# Patient Record
Sex: Female | Born: 1990 | Race: White | Hispanic: No | Marital: Single
Health system: Southern US, Academic
[De-identification: ages and names within clinical notes are randomized; demographics above are authoritative.]

## PROBLEM LIST (undated history)

## (undated) ENCOUNTER — Encounter

## (undated) ENCOUNTER — Encounter: Attending: Pediatrics | Primary: Pediatrics

## (undated) ENCOUNTER — Encounter: Attending: Physician Assistant | Primary: Physician Assistant

## (undated) ENCOUNTER — Encounter: Attending: Family | Primary: Family

## (undated) ENCOUNTER — Ambulatory Visit: Payer: PRIVATE HEALTH INSURANCE | Attending: Registered" | Primary: Registered"

## (undated) ENCOUNTER — Telehealth

## (undated) ENCOUNTER — Encounter
Attending: Student in an Organized Health Care Education/Training Program | Primary: Student in an Organized Health Care Education/Training Program

## (undated) ENCOUNTER — Ambulatory Visit: Payer: PRIVATE HEALTH INSURANCE

## (undated) ENCOUNTER — Ambulatory Visit
Payer: BLUE CROSS/BLUE SHIELD | Attending: Student in an Organized Health Care Education/Training Program | Primary: Student in an Organized Health Care Education/Training Program

## (undated) ENCOUNTER — Ambulatory Visit
Payer: PRIVATE HEALTH INSURANCE | Attending: Student in an Organized Health Care Education/Training Program | Primary: Student in an Organized Health Care Education/Training Program

## (undated) ENCOUNTER — Ambulatory Visit: Payer: PRIVATE HEALTH INSURANCE | Attending: Internal Medicine | Primary: Internal Medicine

## (undated) ENCOUNTER — Ambulatory Visit: Payer: BLUE CROSS/BLUE SHIELD

## (undated) ENCOUNTER — Encounter: Attending: Registered" | Primary: Registered"

## (undated) ENCOUNTER — Ambulatory Visit

## (undated) ENCOUNTER — Ambulatory Visit: Payer: BLUE CROSS/BLUE SHIELD | Attending: Social Worker | Primary: Social Worker

## (undated) ENCOUNTER — Ambulatory Visit: Attending: Social Worker | Primary: Social Worker

## (undated) ENCOUNTER — Telehealth: Attending: Otolaryngology | Primary: Otolaryngology

## (undated) ENCOUNTER — Ambulatory Visit
Attending: Student in an Organized Health Care Education/Training Program | Primary: Student in an Organized Health Care Education/Training Program

## (undated) ENCOUNTER — Ambulatory Visit: Payer: PRIVATE HEALTH INSURANCE | Attending: Social Worker | Primary: Social Worker

## (undated) ENCOUNTER — Encounter: Attending: Social Worker | Primary: Social Worker

## (undated) ENCOUNTER — Ambulatory Visit: Payer: BLUE CROSS/BLUE SHIELD | Attending: Physician Assistant | Primary: Physician Assistant

## (undated) ENCOUNTER — Ambulatory Visit: Payer: PRIVATE HEALTH INSURANCE | Attending: Family | Primary: Family

## (undated) ENCOUNTER — Ambulatory Visit: Payer: PRIVATE HEALTH INSURANCE | Attending: Physician Assistant | Primary: Physician Assistant

## (undated) ENCOUNTER — Ambulatory Visit: Payer: BLUE CROSS/BLUE SHIELD | Attending: Internal Medicine | Primary: Internal Medicine

## (undated) ENCOUNTER — Encounter: Attending: Internal Medicine | Primary: Internal Medicine

## (undated) ENCOUNTER — Encounter: Attending: Otolaryngology | Primary: Otolaryngology

## (undated) ENCOUNTER — Ambulatory Visit
Payer: PRIVATE HEALTH INSURANCE | Attending: Rehabilitative and Restorative Service Providers" | Primary: Rehabilitative and Restorative Service Providers"

## (undated) ENCOUNTER — Other Ambulatory Visit: Attending: Social Worker | Primary: Social Worker

## (undated) ENCOUNTER — Other Ambulatory Visit

## (undated) ENCOUNTER — Telehealth: Attending: Physician Assistant | Primary: Physician Assistant

## (undated) DIAGNOSIS — J45909 Unspecified asthma, uncomplicated: Secondary | ICD-10-CM

## (undated) DIAGNOSIS — Z77120 Contact with and (suspected) exposure to mold (toxic): Secondary | ICD-10-CM

## (undated) DIAGNOSIS — J383 Other diseases of vocal cords: Secondary | ICD-10-CM

## (undated) DIAGNOSIS — R599 Enlarged lymph nodes, unspecified: Secondary | ICD-10-CM

## (undated) HISTORY — DX: Unspecified asthma, uncomplicated: J45.909

## (undated) HISTORY — DX: Other diseases of vocal cords: J38.3

## (undated) HISTORY — DX: Enlarged lymph nodes, unspecified: R59.9

## (undated) HISTORY — PX: WISDOM TOOTH EXTRACTION: SHX21

## (undated) NOTE — *Deleted (*Deleted)
I value your feedback and entrusting us with your care. If you get a [place] patient survey, I would appreciate you taking the time to let us know about your experience today. Thank you!  As of February 18, 2019, your lab results will be released to your MyChart immediately, before I even have a chance to see them. Please give me time to review them and contact you if there are any abnormalities. Thank you for your patience.  

---
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
## 2012-09-03 ENCOUNTER — Emergency Department: Payer: Self-pay | Admitting: Emergency Medicine

## 2012-10-06 ENCOUNTER — Emergency Department: Payer: Self-pay | Admitting: Emergency Medicine

## 2012-10-06 LAB — CBC
HCT: 34.3 % — ABNORMAL LOW (ref 35.0–47.0)
HGB: 11.2 g/dL — ABNORMAL LOW (ref 12.0–16.0)
MCH: 25.6 pg — ABNORMAL LOW (ref 26.0–34.0)
MCHC: 32.6 g/dL (ref 32.0–36.0)
MCV: 79 fL — ABNORMAL LOW (ref 80–100)
Platelet: 360 10*3/uL (ref 150–440)
RBC: 4.36 10*6/uL (ref 3.80–5.20)
RDW: 14.7 % — ABNORMAL HIGH (ref 11.5–14.5)
WBC: 15 10*3/uL — ABNORMAL HIGH (ref 3.6–11.0)

## 2012-10-06 LAB — BASIC METABOLIC PANEL
Anion Gap: 6 — ABNORMAL LOW (ref 7–16)
BUN: 13 mg/dL (ref 7–18)
Chloride: 107 mmol/L (ref 98–107)
Co2: 27 mmol/L (ref 21–32)
EGFR (African American): >60
EGFR (Non-African Amer.): >60
Glucose: 123 mg/dL — ABNORMAL HIGH (ref 65–99)
Osmolality: 281 (ref 275–301)
Sodium: 140 mmol/L (ref 136–145)

## 2012-10-08 ENCOUNTER — Emergency Department: Payer: Self-pay | Admitting: Emergency Medicine

## 2012-10-09 LAB — URINALYSIS, COMPLETE
Blood: NEGATIVE
Glucose,UR: NEGATIVE mg/dL (ref 0–75)
Ketone: NEGATIVE
Ph: 7 (ref 4.5–8.0)
Protein: NEGATIVE
WBC UR: <1 /HPF (ref 0–5)

## 2013-04-05 ENCOUNTER — Emergency Department: Payer: Self-pay | Admitting: Emergency Medicine

## 2013-11-25 ENCOUNTER — Emergency Department: Payer: Self-pay | Admitting: Emergency Medicine

## 2013-12-18 ENCOUNTER — Emergency Department: Payer: Self-pay | Admitting: Emergency Medicine

## 2013-12-18 LAB — URINALYSIS, COMPLETE
Bacteria: NONE SEEN
Bilirubin,UR: NEGATIVE
Blood: NEGATIVE
Glucose,UR: NEGATIVE mg/dL (ref 0–75)
Ketone: NEGATIVE
Leukocyte Esterase: NEGATIVE
Nitrite: NEGATIVE
Ph: 5 (ref 4.5–8.0)
Protein: 30
RBC,UR: 3 /HPF (ref 0–5)
Specific Gravity: 1.033 (ref 1.003–1.030)
Squamous Epithelial: 10

## 2013-12-18 LAB — COMPREHENSIVE METABOLIC PANEL
ALK PHOS: 108 U/L
AST: 19 U/L (ref 15–37)
Albumin: 3.5 g/dL (ref 3.4–5.0)
Anion Gap: 7 (ref 7–16)
BILIRUBIN TOTAL: 0.4 mg/dL (ref 0.2–1.0)
BUN: 11 mg/dL (ref 7–18)
CREATININE: 0.68 mg/dL (ref 0.60–1.30)
Calcium, Total: 8.3 mg/dL — ABNORMAL LOW (ref 8.5–10.1)
Chloride: 103 mmol/L (ref 98–107)
Co2: 26 mmol/L (ref 21–32)
EGFR (Non-African Amer.): >60
GLUCOSE: 97 mg/dL (ref 65–99)
OSMOLALITY: 271 (ref 275–301)
Potassium: 3.3 mmol/L — ABNORMAL LOW (ref 3.5–5.1)
SGPT (ALT): 46 U/L
Sodium: 136 mmol/L (ref 136–145)
Total Protein: 7.2 g/dL (ref 6.4–8.2)

## 2013-12-18 LAB — CBC
HCT: 36 % (ref 35.0–47.0)
HGB: 11.2 g/dL — ABNORMAL LOW (ref 12.0–16.0)
MCH: 25.2 pg — ABNORMAL LOW (ref 26.0–34.0)
MCHC: 31.1 g/dL — AB (ref 32.0–36.0)
MCV: 81 fL (ref 80–100)
PLATELETS: 331 10*3/uL (ref 150–440)
RBC: 4.45 10*6/uL (ref 3.80–5.20)
RDW: 14.4 % (ref 11.5–14.5)
WBC: 10.5 10*3/uL (ref 3.6–11.0)

## 2013-12-18 LAB — PREGNANCY, URINE: Pregnancy Test, Urine: NEGATIVE m[IU]/mL

## 2014-04-24 ENCOUNTER — Emergency Department: Payer: Self-pay | Admitting: Emergency Medicine

## 2015-01-19 ENCOUNTER — Other Ambulatory Visit: Payer: Self-pay | Admitting: Counselor

## 2015-01-19 ENCOUNTER — Ambulatory Visit
Admission: RE | Admit: 2015-01-19 | Discharge: 2015-01-19 | Disposition: A | Discharge status: Home / Self Care | Payer: 59 | Source: Ambulatory Visit | Attending: Counselor | Admitting: Counselor

## 2015-01-19 DIAGNOSIS — R319 Hematuria, unspecified: Secondary | ICD-10-CM

## 2015-01-19 DIAGNOSIS — R1010 Upper abdominal pain, unspecified: Secondary | ICD-10-CM | POA: Insufficient documentation

## 2015-01-19 DIAGNOSIS — R109 Unspecified abdominal pain: Secondary | ICD-10-CM

## 2018-03-11 HISTORY — PX: LIVER BIOPSY: SHX301

## 2018-05-14 DIAGNOSIS — K257 Chronic gastric ulcer without hemorrhage or perforation: Secondary | ICD-10-CM | POA: Insufficient documentation

## 2018-06-26 ENCOUNTER — Emergency Department: Admit: 2018-06-26 | Discharge: 2018-06-26 | Disposition: A | Discharge status: Home / Self Care

## 2018-06-26 ENCOUNTER — Ambulatory Visit: Admit: 2018-06-26 | Discharge: 2018-06-26 | Disposition: A | Discharge status: Home / Self Care

## 2018-06-26 DIAGNOSIS — R1012 Left upper quadrant pain: Principal | ICD-10-CM

## 2018-09-09 DIAGNOSIS — Z77120 Contact with and (suspected) exposure to mold (toxic): Secondary | ICD-10-CM

## 2018-09-09 HISTORY — DX: Contact with and (suspected) exposure to mold (toxic): Z77.120

## 2018-10-02 ENCOUNTER — Ambulatory Visit: Admit: 2018-10-02 | Discharge: 2018-10-03

## 2018-10-02 DIAGNOSIS — Z77120 Contact with and (suspected) exposure to mold (toxic): Secondary | ICD-10-CM

## 2018-10-02 DIAGNOSIS — Z20828 Contact with and (suspected) exposure to other viral communicable diseases: Principal | ICD-10-CM

## 2018-10-02 DIAGNOSIS — R5383 Other fatigue: Secondary | ICD-10-CM

## 2018-10-13 ENCOUNTER — Ambulatory Visit: Admit: 2018-10-13 | Discharge: 2018-10-13 | Disposition: A | Discharge status: Home / Self Care

## 2018-10-13 ENCOUNTER — Emergency Department: Admit: 2018-10-13 | Discharge: 2018-10-13 | Disposition: A | Discharge status: Home / Self Care

## 2018-10-13 DIAGNOSIS — R0602 Shortness of breath: Principal | ICD-10-CM

## 2018-10-16 ENCOUNTER — Telehealth: Admit: 2018-10-16 | Discharge: 2018-10-17

## 2018-10-16 DIAGNOSIS — R52 Pain, unspecified: Principal | ICD-10-CM

## 2018-10-16 DIAGNOSIS — R0602 Shortness of breath: Secondary | ICD-10-CM

## 2018-10-19 ENCOUNTER — Telehealth: Admit: 2018-10-19 | Discharge: 2018-10-20 | Attending: Pediatrics | Primary: Pediatrics

## 2018-10-19 ENCOUNTER — Ambulatory Visit: Admit: 2018-10-19 | Discharge: 2018-10-20

## 2018-10-19 DIAGNOSIS — Z77120 Contact with and (suspected) exposure to mold (toxic): Principal | ICD-10-CM

## 2018-10-19 DIAGNOSIS — R0789 Other chest pain: Secondary | ICD-10-CM

## 2018-10-19 DIAGNOSIS — J310 Chronic rhinitis: Secondary | ICD-10-CM

## 2018-10-19 MED ORDER — ALBUTEROL SULFATE HFA 90 MCG/ACTUATION AEROSOL INHALER
RESPIRATORY_TRACT | 12 refills | 0.00000 days | Status: CP | PRN
Start: 2018-10-19 — End: ?

## 2018-10-19 MED ORDER — AEROCHAMBER MV SPACER
Freq: Once | 0 refills | 0 days | Status: CP | PRN
Start: 2018-10-19 — End: ?

## 2018-10-19 MED ORDER — AZELASTINE 137 MCG (0.1 %) NASAL SPRAY AEROSOL
Freq: Two times a day (BID) | NASAL | 12 refills | 0 days | Status: CP
Start: 2018-10-19 — End: ?

## 2018-10-22 DIAGNOSIS — J4520 Mild intermittent asthma, uncomplicated: Secondary | ICD-10-CM | POA: Insufficient documentation

## 2018-11-19 ENCOUNTER — Other Ambulatory Visit: Payer: Self-pay

## 2018-11-19 NOTE — Progress Notes (Signed)
Called patient no answer left message. Advised no visitor policy and that we will screen her and check temp upon arrival.

## 2018-11-20 ENCOUNTER — Encounter (INDEPENDENT_AMBULATORY_CARE_PROVIDER_SITE_OTHER): Payer: Self-pay

## 2018-11-20 ENCOUNTER — Other Ambulatory Visit: Payer: Self-pay

## 2018-11-20 ENCOUNTER — Encounter: Payer: Self-pay | Admitting: Oncology

## 2018-11-20 ENCOUNTER — Inpatient Hospital Stay: Payer: Self-pay

## 2018-11-20 ENCOUNTER — Inpatient Hospital Stay: Payer: Self-pay | Attending: Oncology | Admitting: Oncology

## 2018-11-20 ENCOUNTER — Ambulatory Visit
Admission: RE | Admit: 2018-11-20 | Discharge: 2018-11-20 | Disposition: A | Discharge status: Home / Self Care | Payer: Self-pay | Source: Ambulatory Visit | Attending: Oncology | Admitting: Oncology

## 2018-11-20 VITALS — BP 117/80 | HR 101 | Temp 96.4°F | Resp 18 | Ht 70.0 in | Wt 268.0 lb

## 2018-11-20 DIAGNOSIS — R590 Localized enlarged lymph nodes: Secondary | ICD-10-CM

## 2018-11-20 DIAGNOSIS — R0602 Shortness of breath: Secondary | ICD-10-CM

## 2018-11-20 DIAGNOSIS — R0789 Other chest pain: Secondary | ICD-10-CM

## 2018-11-20 DIAGNOSIS — R740 Nonspecific elevation of levels of transaminase and lactic acid dehydrogenase [LDH]: Secondary | ICD-10-CM

## 2018-11-20 DIAGNOSIS — R7401 Elevation of levels of liver transaminase levels: Secondary | ICD-10-CM

## 2018-11-20 LAB — COMPREHENSIVE METABOLIC PANEL
ALT: 191 U/L — ABNORMAL HIGH (ref 0–44)
AST: 67 U/L — ABNORMAL HIGH (ref 15–41)
Albumin: 4.1 g/dL (ref 3.5–5.0)
Alkaline Phosphatase: 147 U/L — ABNORMAL HIGH (ref 38–126)
Anion gap: 10 (ref 5–15)
BUN: 13 mg/dL (ref 6–20)
CO2: 25 mmol/L (ref 22–32)
Calcium: 9.2 mg/dL (ref 8.9–10.3)
Chloride: 103 mmol/L (ref 98–111)
Creatinine, Ser: 0.55 mg/dL (ref 0.44–1.00)
GFR calc Af Amer: >60 mL/min (ref >60)
GFR calc non Af Amer: >60 mL/min (ref >60)
Glucose, Bld: 102 mg/dL — ABNORMAL HIGH (ref 70–99)
Potassium: 3.7 mmol/L (ref 3.5–5.1)
Sodium: 138 mmol/L (ref 135–145)
Total Bilirubin: 0.4 mg/dL (ref 0.3–1.2)
Total Protein: 7.5 g/dL (ref 6.5–8.1)

## 2018-11-20 LAB — CBC WITH DIFFERENTIAL/PLATELET
Abs Immature Granulocytes: 0.02 10*3/uL (ref 0.00–0.07)
Basophils Absolute: 0 10*3/uL (ref 0.0–0.1)
Basophils Relative: 0 %
Eosinophils Absolute: 0.1 10*3/uL (ref 0.0–0.5)
Eosinophils Relative: 2 %
HCT: 41.5 % (ref 36.0–46.0)
Hemoglobin: 13.7 g/dL (ref 12.0–15.0)
Immature Granulocytes: 0 %
Lymphocytes Relative: 25 %
Lymphs Abs: 2 10*3/uL (ref 0.7–4.0)
MCH: 27.3 pg (ref 26.0–34.0)
MCHC: 33 g/dL (ref 30.0–36.0)
MCV: 82.8 fL (ref 80.0–100.0)
Monocytes Absolute: 0.4 10*3/uL (ref 0.1–1.0)
Monocytes Relative: 5 %
Neutro Abs: 5.4 10*3/uL (ref 1.7–7.7)
Neutrophils Relative %: 68 %
Platelets: 361 10*3/uL (ref 150–400)
RBC: 5.01 MIL/uL (ref 3.87–5.11)
RDW: 13 % (ref 11.5–15.5)
WBC: 7.9 10*3/uL (ref 4.0–10.5)
nRBC: 0 % (ref 0.0–0.2)

## 2018-11-20 LAB — TECHNOLOGIST SMEAR REVIEW
Plt Morphology: ADEQUATE
RBC Morphology: NORMAL

## 2018-11-20 LAB — C-REACTIVE PROTEIN: CRP: 1.2 mg/dL — ABNORMAL HIGH (ref <1.0)

## 2018-11-20 LAB — HCG, QUANTITATIVE, PREGNANCY: hCG, Beta Chain, Quant, S: <1 m[IU]/mL (ref <5)

## 2018-11-20 LAB — LACTATE DEHYDROGENASE: LDH: 120 U/L (ref 98–192)

## 2018-11-20 MED ORDER — IOHEXOL 350 MG/ML SOLN
75.0000 mL | Freq: Once | INTRAVENOUS | Status: AC | PRN
  Administered 2018-11-20: 14:00:00 75 mL via INTRAVENOUS

## 2018-11-20 NOTE — Progress Notes (Signed)
New patient visit for enlarged lymph nodes.  Prolonged mold exposure .

## 2018-11-21 LAB — ANA W/REFLEX: Anti Nuclear Antibody (ANA): NEGATIVE

## 2018-11-21 NOTE — Progress Notes (Addendum)
Hematology/Oncology Consult note [facility] [phone] [phone]   Patient Care Team: Lorn Junesrawford, Anna E, FNP as PCP - General (Family Medicine)  REFERRING PROVIDER: Lorn Junesrawford, Anna E, FNP  CHIEF COMPLAINTS/REASON FOR VISIT:  Evaluation of cervical and axillary lymph node enlargement.  HISTORY OF PRESENTING ILLNESS:   Marissa Savage is a  28 y.o.  female with PMH listed below was seen in consultation at the request of  Lorn JunesCrawford, Anna E, FNP  for evaluation of cervical and axillary lymph node enlargement. Patient reports a history of mold exposure from January 2000 30 September 2018. She noticed the mold problem in July.  Soon after that she started to experience symptoms including neck lumps, which she thinks is due to lymph node enlargement.  She also felt a lump of the posterior neck/back of her head which was a golf ball size, tender.  Also reports bilateral axillary soreness, no exacerbating or alleviating factors.  Soreness is constant. Also reports to have intermittent wheezing, shortness of breath, and mid chest pain, which is worsened with deep breath.  She feels the chest pain is not getting better.  No exacerbating or alleviating factors. Denies any fever, chills, unintentional weight loss, night sweating.  Patient has history of asthma.  Gastritis, possible IBS.  She sees [facility].   Review of Systems  Constitutional: Positive for fatigue. Negative for appetite change, chills and fever.  HENT:   Negative for hearing loss and voice change.   Eyes: Negative for eye problems.  Respiratory: Negative for chest tightness and cough.   Cardiovascular: Negative for chest pain.  Gastrointestinal: Negative for abdominal distention, abdominal pain and blood in stool.  Endocrine: Negative for hot flashes.  Genitourinary: Negative for difficulty urinating and frequency.   Musculoskeletal: Positive for arthralgias.       Bilateral knee pain,  Skin:  Negative for itching and rash.  Neurological: Negative for extremity weakness.  Hematological: Positive for adenopathy.  Psychiatric/Behavioral: Negative for confusion.  Bilateral axillary soreness.  MEDICAL HISTORY:  Past Medical History:  Diagnosis Date   Asthma    Enlarged lymph nodes    Mold exposure 09/2018   Vocal cord dysfunction     SURGICAL HISTORY: Past Surgical History:  Procedure Laterality Date   WISDOM TOOTH EXTRACTION      SOCIAL HISTORY: Social History   Socioeconomic History   Marital status: Single    Spouse name: Not on file   Number of children: Not on file   Years of education: Not on file   Highest education level: Not on file  Occupational History   Occupation: unemployed  [employer]   Financial resource strain: Not on file   Food insecurity    Worry: Not on file    Inability: Not on file   Transportation needs    Medical: Not on file    Non-medical: Not on file  Tobacco Use   Smoking status: Never Smoker   Smokeless tobacco: Never Used  Substance and Sexual Activity   Alcohol use: Not Currently   Drug use: Not Currently    Types: Marijuana   Sexual activity: Yes  Lifestyle   Physical activity    Days per week: Not on file    Minutes per session: Not on file   Stress: Not on file  Relationships   Social connections    Talks on phone: Not on file    Gets together: Not on file    Attends religious service: Not on  file    Active member of club or organization: Not on file    Attends meetings of clubs or organizations: Not on file    Relationship status: Not on file   Intimate partner violence    Fear of current or ex partner: Not on file    Emotionally abused: Not on file    Physically abused: Not on file    Forced sexual activity: Not on file  Other Topics Concern   Not on file  Social History Narrative   Not on file    FAMILY HISTORY: Family History  Problem Relation Age of Onset   Breast  cancer Maternal Aunt    Leukemia Maternal Grandfather    Stomach cancer Paternal Grandmother     ALLERGIES:  is allergic to ciprofloxacin and zyrtec [cetirizine].  MEDICATIONS:  Current Outpatient Medications  Medication Sig Dispense Refill   albuterol (VENTOLIN HFA) 108 (90 Base) MCG/ACT inhaler Inhale into the lungs.     amoxicillin-clavulanate (AUGMENTIN) 875-125 MG tablet Take by mouth.     dicyclomine (BENTYL) 20 MG tablet Take by mouth.     fluticasone (FLOVENT HFA) 110 MCG/ACT inhaler Inhale into the lungs.     No current facility-administered medications for this visit.      PHYSICAL EXAMINATION: ECOG PERFORMANCE STATUS: 1 - Symptomatic but completely ambulatory Vitals:   11/20/18 1117  BP: 117/80  Pulse: (!) 101  Resp: 18  Temp: (!) 96.4 F (35.8 C)  SpO2: 97%   Filed Weights   11/20/18 1117  Weight: 268 lb (121.6 kg)    Physical Exam Constitutional:      General: She is not in acute distress.    Appearance: She is obese.  HENT:     Head: Normocephalic and atraumatic.  Eyes:     General: No scleral icterus.    Pupils: Pupils are equal, round, and reactive to light.  Neck:     Musculoskeletal: Normal range of motion and neck supple.     Comments: Small soft tender cervical lymph nodes on the right side, less than 1 cm, mobile. Cardiovascular:     Rate and Rhythm: Normal rate and regular rhythm.     Heart sounds: Normal heart sounds.  Pulmonary:     Effort: Pulmonary effort is normal. No respiratory distress.     Breath sounds: No wheezing.  Abdominal:     General: Bowel sounds are normal. There is no distension.     Palpations: Abdomen is soft. There is no mass.     Tenderness: There is no abdominal tenderness.  Musculoskeletal: Normal range of motion.        General: No deformity.  Skin:    General: Skin is warm and dry.     Findings: No erythema or rash.  Neurological:     Mental Status: She is alert and oriented to person, place, and  time.     Cranial Nerves: No cranial nerve deficit.     Coordination: Coordination normal.  Psychiatric:     Comments: Anxious   Will do theBilateral axillary tender with palpation.  No palpable lymphadenopathy.  LABORATORY DATA:  I have reviewed the data as listed Lab Results  Component Value Date   WBC 7.9 11/20/2018   HGB 13.7 11/20/2018   HCT 41.5 11/20/2018   MCV 82.8 11/20/2018   PLT 361 11/20/2018   Recent Labs    11/20/18 1223  NA 138  K 3.7  CL 103  CO2 25  GLUCOSE 102*  BUN 13  CREATININE 0.55  CALCIUM 9.2  GFRNONAA >60  GFRAA >60  PROT 7.5  ALBUMIN 4.1  AST 67*  ALT 191*  ALKPHOS 147*  BILITOT 0.4   Iron/TIBC/Ferritin/ %Sat No results found for: IRON, TIBC, FERRITIN, IRONPCTSAT    RADIOGRAPHIC STUDIES: I have personally reviewed the radiological images as listed and agreed with the findings in the report.  Ct Angio Chest Pe W Or Wo Contrast  Result Date: 11/20/2018 CLINICAL DATA:  28 year old female with acute chest pain. History of mold exposure for 7 months. EXAM: CT ANGIOGRAPHY CHEST WITH CONTRAST TECHNIQUE: Multidetector CT imaging of the chest was performed using the standard protocol during bolus administration of intravenous contrast. Multiplanar CT image reconstructions and MIPs were obtained to evaluate the vascular anatomy. CONTRAST:  75mL OMNIPAQUE IOHEXOL 350 MG/ML SOLN COMPARISON:  06/08/2009 chest CT FINDINGS: Cardiovascular: This is a technically satisfactory study for evaluation of pulmonary emboli. No pulmonary emboli are identified. No thoracic aortic aneurysm noted. UPPER limits of normal heart size noted. No pericardial effusion. Mediastinum/Nodes: No enlarged mediastinal, hilar, or axillary lymph nodes. Thyroid gland, trachea, and esophagus demonstrate no significant findings. Lungs/Pleura: No airspace disease, consolidation, nodule, mass, pleural effusion or pneumothorax noted. No endobronchial or endotracheal lesions are identified.  Upper Abdomen: No acute abnormality. Probable mild hepatic steatosis noted. Musculoskeletal: Unremarkable. Review of the MIP images confirms the above findings. IMPRESSION: 1. No evidence of pulmonary emboli or thoracic aortic aneurysm. 2. UPPER limits of normal heart size. 3. Question mild hepatic steatosis. Electronically Signed   By: Harmon PierJeffrey  Hu M.D.   On: 11/20/2018 14:16      ASSESSMENT & PLAN:  1. Cervical lymphadenopathy   2. Other chest pain   3. Shortness of breath   4. Transaminitis   Physical examination reviewed very small tender cervical lymphadenopathy.  No palpable bilateral axillary lymphadenopathy.  Recommend ultrasound soft tissue neck for further evaluation.  She does not have any constitutional symptoms, less likely lymphoma. Check CBC, CMP, flow cytometry, ANA, CRP, LDH, smear,  Pleuritic chest pain, shortness of breath, need to rule out pulmonary embolism.  Patient does have risk factors including morbid obesity.  Discussed with patient and she agrees with proceeding with CT chest PE angiogram.  CT chest PE angiogram was independently reviewed by me.  There is no pulmonary embolism.  No enlarged mediastinal, hilar, axillary lymph nodes.  Awaiting cervical neck ultrasound. The small cervical lymph nodes most likely reactive in nature. Patient has elevated CRP, likely secondary to underlying inflammatory process. Transaminitis, previous CT abdomen pelvis also showed hepatomegaly and fatty liver disease.  Patient also used to drink alcohol.  Discussed with patient and encourage her efforts of alcohol cessation. Suggest patient to continue follow-up with primary care physician for further evaluation.  #Addendum, ultrasound neck soft tissue showed subcentimeter cervical lymph nodes most likely reactive. chest CT showed no blood clots, no lymph nodes enlargement. There are no axillary lymph node enlargement either.  US neck soft tissue showed very small lymph nodes, less than 1  cm, which are most likely reactive.  Blood work up did not show evidence of leukemia or lymphoma, smear is fine, CRP is elevated.   She should be able to review all her blood work and image reports in my chart in a few days.  No need for follow up with me. I recommend her to continue to follow up with primary care physician.    Orders Placed This Encounter  Procedures   US SOFT  TISSUE HEAD & NECK (NON-THYROID)    Standing Status:   Future    Standing Expiration Date:   01/20/2020    Order Specific Question:   Reason for exam:    Answer:   swollen lymphnodes    Order Specific Question:   Preferred imaging location?    Answer:   [facility]   CT ANGIO CHEST PE W OR WO CONTRAST    PT can leave after exam is complete    Standing Status:   Future    Number of Occurrences:   1    Standing Expiration Date:   02/19/2020    Order Specific Question:   ** REASON FOR EXAM (FREE TEXT)    Answer:   pleuritic chest pain    Order Specific Question:   If indicated for the ordered procedure, I authorize the administration of contrast media per Radiology protocol    Answer:   Yes    Order Specific Question:   Is patient pregnant?    Answer:   Yes    Order Specific Question:   Preferred imaging location?    Answer:   [facility]    Order Specific Question:   Call Results- Best Contact Number?    Answer:   [phone]    Order Specific Question:   Radiology Contrast Protocol - do NOT remove file path    Answer:   \[email or web address].[email or web address]   CBC with Differential/Platelet    Standing Status:   Future    Number of Occurrences:   1    Standing Expiration Date:   11/20/2019   Comprehensive metabolic panel    Standing Status:   Future    Number of Occurrences:   1    Standing Expiration Date:   11/20/2019   Lactate dehydrogenase    Standing Status:   Future    Number of Occurrences:   1    Standing Expiration Date:   11/20/2019   Technologist smear review     Standing Status:   Future    Number of Occurrences:   1    Standing Expiration Date:   11/20/2019   Flow cytometry panel-leukemia/lymphoma work-up    Standing Status:   Future    Number of Occurrences:   1    Standing Expiration Date:   11/20/2019   ANA w/Reflex    Standing Status:   Future    Number of Occurrences:   1    Standing Expiration Date:   11/20/2019   C-reactive protein    Standing Status:   Future    Number of Occurrences:   1    Standing Expiration Date:   11/20/2019    All questions were answered. The patient knows to call the clinic with any problems questions or concerns.  cc Bunnie Pion, FNP   Thank you for this kind referral and the opportunity to participate in the care of this patient. A copy of today's note is routed to referring provider  Total face to face encounter time for this patient visit was 60 min. >50% of the time was  spent in counseling and coordination of care.    Earlie Server, MD, PhD Hematology Oncology [facility] at [facility] Pager- [phone] 11/21/2018

## 2018-11-24 ENCOUNTER — Other Ambulatory Visit: Payer: Self-pay

## 2018-11-24 ENCOUNTER — Telehealth: Payer: Self-pay | Admitting: *Deleted

## 2018-11-24 ENCOUNTER — Encounter: Payer: Self-pay | Admitting: Oncology

## 2018-11-24 ENCOUNTER — Ambulatory Visit
Admission: RE | Admit: 2018-11-24 | Discharge: 2018-11-24 | Disposition: A | Discharge status: Home / Self Care | Payer: Self-pay | Source: Ambulatory Visit | Attending: Oncology | Admitting: Oncology

## 2018-11-24 DIAGNOSIS — R590 Localized enlarged lymph nodes: Secondary | ICD-10-CM | POA: Insufficient documentation

## 2018-11-24 LAB — COMP PANEL: LEUKEMIA/LYMPHOMA

## 2018-11-24 NOTE — Telephone Encounter (Signed)
Asking for results form last week. She has no follow up appointment scheduled   IMPRESSION: 1. No evidence of pulmonary emboli or thoracic aortic aneurysm. 2. UPPER limits of normal heart size. 3. Question mild hepatic steatosis.   Electronically Signed   By: Margarette Canada M.D.   On: 11/20/2018 14:16 Technologist smear review Order: [id number] Status:  Final result  Visible to patient:  No (not released)  Next appt:  None  Dx:  Cervical lymphadenopathy Component 4d ago  WBC Morphology UNREMARKABLE   RBC Morphology RBC MORPHOLOGY NORMAL   Tech Review PLATELETS APPEAR ADEQUATE   Comment: Normal platelet morphology  Performed at [facility], [address] [place], [place] [address]   Resulting Agency [facility] CLIN LAB      Specimen Collected: 11/20/18 12:23 Last Resulted: 11/20/18 13:24     Lab Flowsheet   Order Details   View Encounter   Lab and Collection Details   Routing   Result History         Other Results from 11/20/2018  Flow cytometry panel-leukemia/lymphoma work-up Order: [id number]  Status:  Edited Result - FINAL  Visible to patient:  No (not released)  Next appt:  None  Dx:  Cervical lymphadenopathy Component 4d ago  PATH INTERP XXX-IMP Comment   Comment: No significant immunophenotypic abnormality detected  CLINICAL INFO Comment VC   Comment: Accompanying CBC dated 11/20/2018 shows: WBC 7.9, Neu 5.4, Lym 2.0, Mon 0.4.  Specimen Type Comment   Comment: Peripheral blood  ASSESSMENT OF LEUKOCYTES Comment   Comment: (NOTE)  No monoclonal B cell population is detected. kappa:lambda ratio 1.2  There is no loss of, or aberrant expression of, the pan T cell  antigens to  suggest a neoplastic T cell process.  CD4:CD8 ratio 3.6  No circulating blasts are detected.  There is no immunophenotypic evidence of abnormal myeloid maturation.  Analysis of the leukocyte population shows: granulocytes 75%,  monocytes 5%,  lymphocytes 20%, blasts <0.1%,  B cells 4%, T cells 15%, NK cells 1%.   % Viable Cells Comment VC   Comment: 97%  ANALYSIS AND GATING STRATEGY Comment   Comment: 8 color analysis with CD45/SSC gating  IMMUNOPHENOTYPING STUDY Comment   Comment: (NOTE)  CD2    Normal     CD3    Normal  CD4    Normal     CD5    Normal  CD7    Normal     CD8    Normal  CD10   Normal     CD11b   Normal  CD13   Normal     CD14   Normal  CD16   Normal     CD19   Normal  CD20   Normal     CD33   Normal  CD34   Normal     CD38   Normal  CD45   Normal     CD56   Normal  CD57   Normal     CD117   Normal  HLA-DR  Normal     KAPPA   Normal  LAMBDA  Normal     CD64   Normal   PATHOLOGIST NAME Comment   Comment: Gaston Islam, M.D.  COMMENT: Comment VC   Comment: (NOTE)  Each antibody in this assay was utilized to assess for potential  abnormalities of studied cell populations or to characterize  identified abnormalities.  This test was developed and its performance characteristics  determined by [facility]. It  has not been cleared or approved by the  U.S. Food and Drug Administration.  The FDA has determined that such clearance or approval is not  necessary. This test is used for clinical purposes. It should not  be regarded as investigational or for research.  Performed At: -[facility]  [address] [place], [place] [id number]  Katina Degree MDPhD UD:[id number]  Performed At: [facility]  [address] [place], [place] [id number]  Katina Degree MDPhD [id number]   Resulting Agency [facility] CLIN LAB      Specimen Collected: 11/20/18 12:23 Last Resulted: 11/24/18 14:37     Lab Flowsheet   Order Details   View Encounter   Lab and Collection Details   Routing   Result History     VC=Value has a corrected status         Lactate dehydrogenase Order: [id number]  Status:  Final result  Visible to  patient:  No (not released)  Next appt:  None  Dx:  Cervical lymphadenopathy  Ref Range & Units 4d ago  LDH 98 - 192 U/L 120   Comment: Performed at [facility], [address] [place], [place] [address]  Resulting Agency  [facility] CLIN LAB      Specimen Collected: 11/20/18 12:23 Last Resulted: 11/20/18 12:42     Lab Flowsheet   Order Details   View Encounter   Lab and Collection Details   Routing   Result History           Contains abnormal data Comprehensive metabolic panel Order: [id number]  Status:  Final result  Visible to patient:  No (not released)  Next appt:  None  Dx:  Cervical lymphadenopathy  Ref Range & Units 4d ago  Sodium 135 - 145 mmol/L 138   Potassium 3.5 - 5.1 mmol/L 3.7   Chloride 98 - 111 mmol/L 103   CO2 22 - 32 mmol/L 25   Glucose, Bld 70 - 99 mg/dL 102High    BUN 6 - 20 mg/dL 13   Creatinine, Ser 0.44 - 1.00 mg/dL 0.55   Calcium 8.9 - 10.3 mg/dL 9.2   Total Protein 6.5 - 8.1 g/dL 7.5   Albumin 3.5 - 5.0 g/dL 4.1   AST 15 - 41 U/L 67High    ALT 0 - 44 U/L 191High    Alkaline Phosphatase 38 - 126 U/L 147High    Total Bilirubin 0.3 - 1.2 mg/dL 0.4   GFR calc non Af Amer >60 mL/min >60   GFR calc Af Amer >60 mL/min >60   Anion gap 5 - 15 10   Comment: Performed at [facility], [address] [place], [place] [address]  Resulting Agency  [facility] CLIN LAB      Specimen Collected: 11/20/18 12:23 Last Resulted: 11/20/18 12:42     Lab Flowsheet   Order Details   View Encounter   Lab and Collection Details   Routing   Result History           CBC with Differential/Platelet Order: [id number]  Status:  Final result  Visible to patient:  No (not released)  Next appt:  None  Dx:  Cervical lymphadenopathy  Ref Range & Units 4d ago  WBC 4.0 - 10.5 K/uL 7.9   RBC 3.87 - 5.11 MIL/uL 5.01   Hemoglobin 12.0 - 15.0 g/dL 13.7   HCT 36.0 - 46.0 % 41.5   MCV 80.0 - 100.0 fL 82.8   MCH 26.0 -  34.0 pg 27.3   MCHC 30.0 - 36.0  g/dL 33.0   RDW 11.5 - 15.5 % 13.0   Platelets 150 - 400 K/uL 361   nRBC 0.0 - 0.2 % 0.0   Neutrophils Relative % % 68   Neutro Abs 1.7 - 7.7 K/uL 5.4   Lymphocytes Relative % 25   Lymphs Abs 0.7 - 4.0 K/uL 2.0   Monocytes Relative % 5   Monocytes Absolute 0.1 - 1.0 K/uL 0.4   Eosinophils Relative % 2   Eosinophils Absolute 0.0 - 0.5 K/uL 0.1   Basophils Relative % 0   Basophils Absolute 0.0 - 0.1 K/uL 0.0   Immature Granulocytes % 0   Abs Immature Granulocytes 0.00 - 0.07 K/uL 0.02   Comment: Performed at [facility], [address] [place], [place] [address]  Resulting Agency  [facility] CLIN LAB      Specimen Collected: 11/20/18 12:23 Last Resulted: 11/20/18 12:30     Lab Flowsheet   Order Details   View Encounter   Lab and Collection Details   Routing   Result History           Contains abnormal data C-reactive protein Order: [id number]  Status:  Final result  Visible to patient:  No (not released)  Next appt:  None  Dx:  Other chest pain; Cervical lymphadeno...  Ref Range & Units 4d ago  CRP <1.0 mg/dL 1.2High    Comment: Performed at [facility] [address], [place], [place] [address]  Resulting Agency  [facility] CLIN LAB      Specimen Collected: 11/20/18 12:05 Last Resulted: 11/20/18 20:53     Lab Flowsheet   Order Details   View Encounter   Lab and Collection Details   Routing   Result History           ANA w/Reflex Order: [id number]  Status:  Final result  Visible to patient:  No (not released)  Next appt:  None  Dx:  Other chest pain; Cervical lymphadeno...  Ref Range & Units 4d ago  Anti Nuclear Antibody (ANA) Negative Negative   Comment: (NOTE)  Performed At: [facility]  [place], [place] [id number]  Rush Farmer MD [id number]   Resulting Agency  [facility] CLIN LAB      Specimen Collected: 11/20/18 12:05 Last Resulted: 11/21/18 13:37

## 2018-11-25 ENCOUNTER — Telehealth: Payer: Self-pay

## 2018-11-25 ENCOUNTER — Encounter: Payer: Self-pay | Admitting: Oncology

## 2018-11-25 NOTE — Telephone Encounter (Signed)
She can discuss further with her PCP for the reported mold related symptoms. I am no the expert in this field.

## 2018-11-25 NOTE — Telephone Encounter (Signed)
Patient would like to know what is the next step she should take.  Dr. Tasia Catchings mentioned seeing a pulmonologist and she is interested.

## 2018-11-25 NOTE — Telephone Encounter (Signed)
Patient informed to discuss other referrals with PCP

## 2018-11-25 NOTE — Telephone Encounter (Signed)
Dr. Tasia Catchings response to results:  Please let patient know that her chest CT showed no blood clots, no lymph nodes enlargement. There are no axillary lymph node enlargement either.  US neck soft tissue showed very small lymph nodes, less than 1 cm, which are most likely reactive.  Blood work up did not show evidence of leukemia or lymphoma, smear is fine, CRP is elevated.  CRP is a marker of inflammation.   She should be able to review all her blood work and image reports in my chart in a few days.  No need for follow up with me. I recommend her to continue to follow up with primary care physician.

## 2018-11-25 NOTE — Telephone Encounter (Signed)
Called patient and discuss lab results in more detail.  She now better understands them.

## 2018-11-26 ENCOUNTER — Encounter: Payer: Self-pay | Admitting: Oncology

## 2018-12-02 DIAGNOSIS — R7989 Other specified abnormal findings of blood chemistry: Secondary | ICD-10-CM | POA: Insufficient documentation

## 2018-12-02 DIAGNOSIS — K760 Fatty (change of) liver, not elsewhere classified: Secondary | ICD-10-CM | POA: Insufficient documentation

## 2018-12-17 ENCOUNTER — Ambulatory Visit: Admit: 2018-12-17 | Discharge: 2019-01-15 | Attending: Audiologist | Primary: Audiologist

## 2018-12-17 ENCOUNTER — Ambulatory Visit: Admit: 2018-12-17 | Discharge: 2018-12-18 | Attending: Otolaryngology | Primary: Otolaryngology

## 2018-12-17 DIAGNOSIS — R0789 Other chest pain: Secondary | ICD-10-CM

## 2018-12-17 DIAGNOSIS — R490 Dysphonia: Secondary | ICD-10-CM

## 2018-12-17 DIAGNOSIS — J383 Other diseases of vocal cords: Secondary | ICD-10-CM

## 2019-03-10 ENCOUNTER — Telehealth (INDEPENDENT_AMBULATORY_CARE_PROVIDER_SITE_OTHER): Payer: Self-pay | Admitting: Obstetrics and Gynecology

## 2019-03-10 ENCOUNTER — Ambulatory Visit: Payer: Self-pay | Admitting: Obstetrics and Gynecology

## 2019-03-10 ENCOUNTER — Ambulatory Visit: Payer: Self-pay

## 2019-03-10 DIAGNOSIS — N3000 Acute cystitis without hematuria: Secondary | ICD-10-CM

## 2019-03-10 DIAGNOSIS — R399 Unspecified symptoms and signs involving the genitourinary system: Secondary | ICD-10-CM

## 2019-03-10 LAB — POCT URINALYSIS DIPSTICK
Bilirubin, UA: NEGATIVE
Blood, UA: NEGATIVE
Glucose, UA: NEGATIVE
Ketones, UA: NEGATIVE
Leukocytes, UA: NEGATIVE
Nitrite, UA: NEGATIVE
Protein, UA: POSITIVE — AB
Spec Grav, UA: 1.025 (ref 1.010–1.025)
Urobilinogen, UA: 0.2 E.U./dL
pH, UA: 6 (ref 5.0–8.0)

## 2019-03-10 NOTE — Telephone Encounter (Signed)
Pt with UTI sx--dysuria, urinary frequency with and without good flow, urgency, urine odor, pelvic pressure, and LBP, No fevers. Had UTI a couple months ago and treated with augmentin at [facility] with some sx improvement, but sx have persisted. Having increased d/c with itch/odor for a couple wks.   Had [facility] appt but went to [facility] office. RN did UA and C&S. If C&S neg, pt to sched appt early next wk for vag d/c eval. If C&S pos, I will send in abx over wknd. F/u sooner prn.  Results for orders placed or performed in visit on 03/10/19 (from the past 24 hour(s))  POCT Urinalysis Dipstick     Status: Abnormal   Collection Time: 03/10/19 11:53 AM  Result Value Ref Range   Color, UA Gold    Clarity, UA Clear    Glucose, UA Negative Negative   Bilirubin, UA Negative    Ketones, UA Negative    Spec Grav, UA 1.025 1.010 - 1.025   Blood, UA Negative    pH, UA 6.0 5.0 - 8.0   Protein, UA Positive (A) Negative   Urobilinogen, UA 0.2 0.2 or 1.0 E.U./dL   Nitrite, UA Negative    Leukocytes, UA Negative Negative   Appearance     Odor

## 2019-03-12 LAB — URINE CULTURE

## 2019-03-17 ENCOUNTER — Emergency Department
Admit: 2019-03-17 | Discharge: 2019-03-17 | Disposition: A | Discharge status: Home / Self Care | Payer: BLUE CROSS/BLUE SHIELD

## 2019-03-17 ENCOUNTER — Encounter
Admit: 2019-03-17 | Discharge: 2019-03-17 | Disposition: A | Discharge status: Home / Self Care | Payer: BLUE CROSS/BLUE SHIELD

## 2019-03-19 ENCOUNTER — Ambulatory Visit: Payer: Self-pay | Admitting: Obstetrics and Gynecology

## 2019-03-23 ENCOUNTER — Ambulatory Visit: Payer: Self-pay | Admitting: Obstetrics and Gynecology

## 2019-03-23 NOTE — Progress Notes (Deleted)
    Lorn Junes, FNP   No chief complaint on file.   HPI:      [name] is a 29 y.o. No obstetric history on file. who LMP was No LMP recorded., presents today for ***  Neg C&S 03/10/19 for dysuria.   There are no problems to display for this patient.   Past Surgical History:  Procedure Laterality Date  . WISDOM TOOTH EXTRACTION      Family History  Problem Relation Age of Onset  . Breast cancer Maternal Aunt   . Leukemia Maternal Grandfather   . Stomach cancer Paternal Grandmother   . Pancreatic cancer Paternal Grandfather     Social History   Socioeconomic History  . Marital status: Single    Spouse name: Not on file  . Number of children: Not on file  . Years of education: Not on file  . Highest education level: Not on file  Occupational History  . Occupation: unemployed  Tobacco Use  . Smoking status: Never Smoker  . Smokeless tobacco: Never Used  Substance and Sexual Activity  . Alcohol use: Not Currently  . Drug use: Not Currently    Types: Marijuana  . Sexual activity: Yes  Other Topics Concern  . Not on file  Social History Narrative  . Not on file   Social Determinants of Health   Financial Resource Strain:   . Difficulty of Paying Living Expenses: Not on file  Food Insecurity:   . Worried About Programme researcher, broadcasting/film/video in the Last Year: Not on file  . Ran Out of Food in the Last Year: Not on file  Transportation Needs:   . Lack of Transportation (Medical): Not on file  . Lack of Transportation (Non-Medical): Not on file  Physical Activity:   . Days of Exercise per Week: Not on file  . Minutes of Exercise per Session: Not on file  Stress:   . Feeling of Stress : Not on file  Social Connections:   . Frequency of Communication with Friends and Family: Not on file  . Frequency of Social Gatherings with Friends and Family: Not on file  . Attends Religious Services: Not on file  . Active Member of Clubs or Organizations: Not on file   . Attends Banker Meetings: Not on file  . Marital Status: Not on file  Intimate Partner Violence:   . Fear of Current or Ex-Partner: Not on file  . Emotionally Abused: Not on file  . Physically Abused: Not on file  . Sexually Abused: Not on file    Outpatient Medications Prior to Visit  Medication Sig Dispense Refill  . albuterol (VENTOLIN HFA) 108 (90 Base) MCG/ACT inhaler Inhale into the lungs.    . dicyclomine (BENTYL) 20 MG tablet Take by mouth.    . fluticasone (FLOVENT HFA) 110 MCG/ACT inhaler Inhale into the lungs.     No facility-administered medications prior to visit.      ROS:  Review of Systems BREAST: No symptoms   OBJECTIVE:   Vitals:  There were no vitals taken for this visit.  Physical Exam  Results: No results found for this or any previous visit (from the past 24 hour(s)).   Assessment/Plan: No diagnosis found.    No orders of the defined types were placed in this encounter.     No follow-ups on file.  Vianey Caniglia B. Deronte Solis, PA-C 03/23/2019 10:52 AM

## 2019-06-03 ENCOUNTER — Ambulatory Visit: Payer: Self-pay | Admitting: Obstetrics and Gynecology

## 2019-06-03 ENCOUNTER — Encounter: Payer: Self-pay | Admitting: Obstetrics and Gynecology

## 2019-06-03 ENCOUNTER — Other Ambulatory Visit: Payer: Self-pay

## 2019-06-03 VITALS — BP 114/66 | HR 90 | Ht 70.0 in | Wt 273.0 lb

## 2019-06-03 DIAGNOSIS — R300 Dysuria: Secondary | ICD-10-CM

## 2019-06-03 DIAGNOSIS — N760 Acute vaginitis: Secondary | ICD-10-CM

## 2019-06-03 LAB — POCT URINALYSIS DIPSTICK
Bilirubin, UA: NEGATIVE
Blood, UA: NEGATIVE
Glucose, UA: NEGATIVE
Ketones, UA: NEGATIVE
Leukocytes, UA: NEGATIVE
Nitrite, UA: NEGATIVE
Protein, UA: NEGATIVE
Spec Grav, UA: 1.01 (ref 1.010–1.025)
Urobilinogen, UA: NEGATIVE E.U./dL — AB
pH, UA: 6 (ref 5.0–8.0)

## 2019-06-03 NOTE — Progress Notes (Signed)
Obstetrics & Gynecology Office Visit   Chief Complaint  Patient presents with  . Vaginitis    itching, discharge w/odor, burning   History of Present Illness: 29 y.o. G0P0000 female who presents with vaginal itching, burning, malodorous discharge.  She notes the itching and burning both just on the inside of her vagina. This started about 1.5 months ago and has gradually gotten worse.  She has tried no treatments.  This has occurred before - she has had a yeast infection and UTI.  THis doesn't feel like a UTI (or as bad as one) to her.  But, she does have pain in her kidneys, right greater than left.  She denies fevers, chills.  With her discharge, it is described as white, off-white color and thin.  Her last pap smear was normal at [facility] and was about 1 year ago.    Past Medical History:  Diagnosis Date  . Asthma   . Enlarged lymph nodes   . Mold exposure 09/2018  . Vocal cord dysfunction    Past Surgical History:  Procedure Laterality Date  . LIVER BIOPSY  2020  . WISDOM TOOTH EXTRACTION     Gynecologic History: Patient's last menstrual period was 05/13/2019.  Obstetric History: G0  Family History  Problem Relation Age of Onset  . Breast cancer Maternal Aunt   . Leukemia Maternal Grandfather   . Stomach cancer Paternal Grandmother   . Pancreatic cancer Paternal Grandfather     Social History   Socioeconomic History  . Marital status: Single    Spouse name: Not on file  . Number of children: Not on file  . Years of education: Not on file  . Highest education level: Not on file  Occupational History  . Occupation: unemployed  Tobacco Use  . Smoking status: Never Smoker  . Smokeless tobacco: Never Used  Substance and Sexual Activity  . Alcohol use: Yes    Comment: occ  . Drug use: Yes    Types: Marijuana  . Sexual activity: Yes    Birth control/protection: None  Other Topics Concern  . Not on file  Social History Narrative  . Not on file   Social  Determinants of Health   Financial Resource Strain:   . Difficulty of Paying Living Expenses:   Food Insecurity:   . Worried About Charity fundraiser in the Last Year:   . Arboriculturist in the Last Year:   Transportation Needs:   . Film/video editor (Medical):   Marland Kitchen Lack of Transportation (Non-Medical):   Physical Activity:   . Days of Exercise per Week:   . Minutes of Exercise per Session:   Stress:   . Feeling of Stress :   Social Connections:   . Frequency of Communication with Friends and Family:   . Frequency of Social Gatherings with Friends and Family:   . Attends Religious Services:   . Active Member of Clubs or Organizations:   . Attends Archivist Meetings:   Marland Kitchen Marital Status:   Intimate Partner Violence:   . Fear of Current or Ex-Partner:   . Emotionally Abused:   Marland Kitchen Physically Abused:   . Sexually Abused:     Allergies  Allergen Reactions  . Ciprofloxacin   . Zyrtec [Cetirizine]    Prior to Admission medications   Medication Sig Start Date End Date Taking? Authorizing Provider  albuterol (VENTOLIN HFA) 108 (90 Base) MCG/ACT inhaler Inhale into the lungs. 10/19/18   [provider]  dicyclomine (BENTYL) 20 MG tablet Take by mouth. 10/22/18 10/22/19  [provider]  fluticasone (FLOVENT HFA) 110 MCG/ACT inhaler Inhale into the lungs.    [provider]    Review of Systems  Constitutional: Negative.   HENT: Negative.   Eyes: Negative.   Respiratory: Negative.   Cardiovascular: Negative.   Gastrointestinal: Negative.   Genitourinary: Negative.        Per HPI  Musculoskeletal: Negative.   Skin: Negative.   Neurological: Negative.   Psychiatric/Behavioral: Negative.      Physical Exam BP 114/66   Pulse 90   Ht 5\' 10"  (1.778 m)   Wt 273 lb (123.8 kg)   LMP 05/13/2019   BMI 39.17 kg/m  Patient's last menstrual period was 05/13/2019. Physical Exam Constitutional:      General: She is not in acute distress.     Appearance: Normal appearance. She is well-developed.  Genitourinary:     Pelvic exam was performed with patient in the lithotomy position.     Vulva, inguinal canal, urethra, bladder, vagina, uterus, right adnexa and left adnexa normal.     No posterior fourchette tenderness, injury or lesion present.     No cervical friability, lesion, bleeding or polyp.  HENT:     Head: Normocephalic and atraumatic.  Eyes:     General: No scleral icterus.    Conjunctiva/sclera: Conjunctivae normal.  Cardiovascular:     Rate and Rhythm: Normal rate and regular rhythm.     Heart sounds: No murmur. No friction rub. No gallop.   Pulmonary:     Effort: Pulmonary effort is normal. No respiratory distress.     Breath sounds: Normal breath sounds. No wheezing or rales.  Abdominal:     General: Bowel sounds are normal. There is no distension.     Palpations: Abdomen is soft. There is no mass.     Tenderness: There is no abdominal tenderness. There is no right CVA tenderness, left CVA tenderness, guarding or rebound.  Musculoskeletal:        General: Normal range of motion.     Cervical back: Normal range of motion and neck supple.  Neurological:     General: No focal deficit present.     Mental Status: She is alert and oriented to person, place, and time.     Cranial Nerves: No cranial nerve deficit.  Skin:    General: Skin is warm and dry.     Findings: No erythema.  Psychiatric:        Mood and Affect: Mood normal.        Behavior: Behavior normal.        Judgment: Judgment normal.    Female chaperone present for pelvic and breast  portions of the physical exam  UA: normal  Wet Prep: PH: 4.5 Clue Cells: Negative Fungal elements: Negative Trichomonas: Negative   Assessment: 29 y.o. No obstetric history on file. female here for  1. Acute vaginitis   2. Dysuria      Plan: Problem List Items Addressed This Visit    None    Visit Diagnoses    Acute vaginitis    -  Primary   Relevant  Orders   NuSwab Vaginitis Plus (VG+)   Dysuria       Relevant Orders   POCT urinalysis dipstick (Completed)     A total of 27 minutes were spent face-to-face with the patient as well as preparation, review, communication, and documentation during this encounter  for the above-noted issues.    Thomasene Mohair, MD 06/03/2019 3:44 PM

## 2019-06-06 LAB — NUSWAB VAGINITIS PLUS (VG+)
Candida albicans, NAA: NEGATIVE
Candida glabrata, NAA: NEGATIVE
Chlamydia trachomatis, NAA: NEGATIVE
Neisseria gonorrhoeae, NAA: NEGATIVE
Trich vag by NAA: NEGATIVE

## 2019-08-03 ENCOUNTER — Ambulatory Visit: Admit: 2019-08-03 | Discharge: 2019-08-04 | Attending: Otolaryngology | Primary: Otolaryngology

## 2019-08-03 DIAGNOSIS — H6123 Impacted cerumen, bilateral: Principal | ICD-10-CM

## 2019-08-03 DIAGNOSIS — J039 Acute tonsillitis, unspecified: Principal | ICD-10-CM

## 2019-08-03 DIAGNOSIS — R490 Dysphonia: Principal | ICD-10-CM

## 2019-08-03 MED ORDER — NEOMYCIN-POLYMYXIN-HYDROCORT 3.5 MG/ML-10,000 UNIT/ML-1 % EAR SOLUTION
Freq: Four times a day (QID) | OTIC | 2 refills | 0 days | Status: CP
Start: 2019-08-03 — End: 2019-08-13

## 2019-08-03 MED ORDER — AMOXICILLIN 250 MG CAPSULE
ORAL_CAPSULE | Freq: Three times a day (TID) | ORAL | 0 refills | 4 days | Status: CP
Start: 2019-08-03 — End: 2019-08-10

## 2019-08-19 ENCOUNTER — Ambulatory Visit
Admit: 2019-08-19 | Discharge: 2019-08-20 | Attending: Student in an Organized Health Care Education/Training Program | Primary: Student in an Organized Health Care Education/Training Program

## 2019-08-19 DIAGNOSIS — H6123 Impacted cerumen, bilateral: Principal | ICD-10-CM

## 2019-08-19 MED ORDER — NEOMYCIN-POLYMYXIN-HYDROCORT 3.5 MG/ML-10,000 UNIT/ML-1 % EAR SOLUTION
Freq: Four times a day (QID) | OTIC | 2 refills | 0.00000 days | Status: CP
Start: 2019-08-19 — End: 2019-08-29

## 2019-08-24 ENCOUNTER — Other Ambulatory Visit: Payer: Self-pay | Admitting: Obstetrics and Gynecology

## 2019-08-24 ENCOUNTER — Telehealth: Payer: Self-pay

## 2019-08-24 DIAGNOSIS — B3731 Acute candidiasis of vulva and vagina: Secondary | ICD-10-CM

## 2019-08-24 MED ORDER — FLUCONAZOLE 150 MG PO TABS: 150.0000 mg | ORAL_TABLET | Freq: Once | ORAL | 0 refills | Status: AC

## 2019-08-24 NOTE — Telephone Encounter (Signed)
Spoke w/patient. She was rx'd abx by ENT for ear infection. She is having vaginal d/c w/itching/irritation (very uncomfortable)

## 2019-08-24 NOTE — Telephone Encounter (Signed)
Patient reports she just got off and abx and has a yeast infection and is pretty miserable. Requesting rx. WS#[phone]

## 2019-08-24 NOTE — Telephone Encounter (Signed)
Rx sent to pharmacy or record

## 2019-08-24 NOTE — Telephone Encounter (Signed)
LMVM to notify rx sent 

## 2019-10-12 ENCOUNTER — Ambulatory Visit: Admit: 2019-10-12 | Attending: Family | Primary: Family

## 2019-11-08 ENCOUNTER — Ambulatory Visit: Admit: 2019-11-08

## 2019-12-02 ENCOUNTER — Ambulatory Visit: Payer: Self-pay | Admitting: Obstetrics and Gynecology

## 2019-12-02 NOTE — Progress Notes (Deleted)
Marissa Junes, FNP   No chief complaint on file.   HPI:      [name] is a 29 y.o. G0P0000 whose LMP was No LMP recorded., presents today for *** Treated with ddeiflucan 6/21 Neg nuswab culture 3/21  Past Medical History:  Diagnosis Date  . Asthma   . Enlarged lymph nodes   . Mold exposure 09/2018  . Vocal cord dysfunction     Past Surgical History:  Procedure Laterality Date  . LIVER BIOPSY  2020  . WISDOM TOOTH EXTRACTION      Family History  Problem Relation Age of Onset  . Breast cancer Maternal Aunt   . Leukemia Maternal Grandfather   . Stomach cancer Paternal Grandmother   . Pancreatic cancer Paternal Grandfather     Social History   Socioeconomic History  . Marital status: Single    Spouse name: Not on file  . Number of children: Not on file  . Years of education: Not on file  . Highest education level: Not on file  Occupational History  . Occupation: unemployed  Tobacco Use  . Smoking status: Never Smoker  . Smokeless tobacco: Never Used  Vaping Use  . Vaping Use: Never used  Substance and Sexual Activity  . Alcohol use: Yes    Comment: occ  . Drug use: Yes    Types: Marijuana  . Sexual activity: Yes    Birth control/protection: None  Other Topics Concern  . Not on file  Social History Narrative  . Not on file   Social Determinants of Health   Financial Resource Strain:   . Difficulty of Paying Living Expenses: Not on file  Food Insecurity:   . Worried About Programme researcher, broadcasting/film/video in the Last Year: Not on file  . Ran Out of Food in the Last Year: Not on file  Transportation Needs:   . Lack of Transportation (Medical): Not on file  . Lack of Transportation (Non-Medical): Not on file  Physical Activity:   . Days of Exercise per Week: Not on file  . Minutes of Exercise per Session: Not on file  Stress:   . Feeling of Stress : Not on file  Social Connections:   . Frequency of Communication with Friends and Family: Not on  file  . Frequency of Social Gatherings with Friends and Family: Not on file  . Attends Religious Services: Not on file  . Active Member of Clubs or Organizations: Not on file  . Attends Banker Meetings: Not on file  . Marital Status: Not on file  Intimate Partner Violence:   . Fear of Current or Ex-Partner: Not on file  . Emotionally Abused: Not on file  . Physically Abused: Not on file  . Sexually Abused: Not on file    Outpatient Medications Prior to Visit  Medication Sig Dispense Refill  . albuterol (VENTOLIN HFA) 108 (90 Base) MCG/ACT inhaler Inhale into the lungs.    . dicyclomine (BENTYL) 20 MG tablet Take by mouth.    . fluticasone (FLOVENT HFA) 110 MCG/ACT inhaler Inhale into the lungs.     No facility-administered medications prior to visit.      ROS:  Review of Systems BREAST: No symptoms   OBJECTIVE:   Vitals:  There were no vitals taken for this visit.  Physical Exam  Results: No results found for this or any previous visit (from the past 24 hour(s)).   Assessment/Plan: No diagnosis found.  No orders of the defined types were placed in this encounter.     No follow-ups on file.  Isaac Lacson B. Jireh Elmore, PA-C 12/02/2019 11:10 AM

## 2019-12-03 ENCOUNTER — Ambulatory Visit: Payer: Self-pay | Admitting: Obstetrics and Gynecology

## 2020-01-19 ENCOUNTER — Ambulatory Visit: Payer: Self-pay | Admitting: Obstetrics and Gynecology

## 2020-01-19 ENCOUNTER — Ambulatory Visit: Admit: 2020-01-19 | Discharge: 2020-01-20 | Attending: Family | Primary: Family

## 2020-01-19 DIAGNOSIS — M7711 Lateral epicondylitis, right elbow: Principal | ICD-10-CM

## 2020-01-19 DIAGNOSIS — M5440 Lumbago with sciatica, unspecified side: Principal | ICD-10-CM

## 2020-01-19 MED ORDER — NAPROXEN SODIUM 220 MG TABLET
ORAL_TABLET | Freq: Two times a day (BID) | ORAL | 3 refills | 15 days
Start: 2020-01-19 — End: 2021-01-18

## 2020-01-19 NOTE — Progress Notes (Deleted)
Lorn Junes, FNP   No chief complaint on file.   HPI:      [name] is a 29 y.o. G0P0000 whose LMP was No LMP recorded., presents today for *** Treated with diflucan 6/21 after abx use Neg yeast/BV on culture 3/21  Past Medical History:  Diagnosis Date  . Asthma   . Enlarged lymph nodes   . Mold exposure 09/2018  . Vocal cord dysfunction     Past Surgical History:  Procedure Laterality Date  . LIVER BIOPSY  2020  . WISDOM TOOTH EXTRACTION      Family History  Problem Relation Age of Onset  . Breast cancer Maternal Aunt   . Leukemia Maternal Grandfather   . Stomach cancer Paternal Grandmother   . Pancreatic cancer Paternal Grandfather     Social History   Socioeconomic History  . Marital status: Single    Spouse name: Not on file  . Number of children: Not on file  . Years of education: Not on file  . Highest education level: Not on file  Occupational History  . Occupation: unemployed  Tobacco Use  . Smoking status: Never Smoker  . Smokeless tobacco: Never Used  Vaping Use  . Vaping Use: Never used  Substance and Sexual Activity  . Alcohol use: Yes    Comment: occ  . Drug use: Yes    Types: Marijuana  . Sexual activity: Yes    Birth control/protection: None  Other Topics Concern  . Not on file  Social History Narrative  . Not on file   Social Determinants of Health   Financial Resource Strain:   . Difficulty of Paying Living Expenses: Not on file  Food Insecurity:   . Worried About Programme researcher, broadcasting/film/video in the Last Year: Not on file  . Ran Out of Food in the Last Year: Not on file  Transportation Needs:   . Lack of Transportation (Medical): Not on file  . Lack of Transportation (Non-Medical): Not on file  Physical Activity:   . Days of Exercise per Week: Not on file  . Minutes of Exercise per Session: Not on file  Stress:   . Feeling of Stress : Not on file  Social Connections:   . Frequency of Communication with Friends  and Family: Not on file  . Frequency of Social Gatherings with Friends and Family: Not on file  . Attends Religious Services: Not on file  . Active Member of Clubs or Organizations: Not on file  . Attends Banker Meetings: Not on file  . Marital Status: Not on file  Intimate Partner Violence:   . Fear of Current or Ex-Partner: Not on file  . Emotionally Abused: Not on file  . Physically Abused: Not on file  . Sexually Abused: Not on file    Outpatient Medications Prior to Visit  Medication Sig Dispense Refill  . albuterol (VENTOLIN HFA) 108 (90 Base) MCG/ACT inhaler Inhale into the lungs.    . dicyclomine (BENTYL) 20 MG tablet Take by mouth.    . fluticasone (FLOVENT HFA) 110 MCG/ACT inhaler Inhale into the lungs.     No facility-administered medications prior to visit.      ROS:  Review of Systems BREAST: No symptoms   OBJECTIVE:   Vitals:  There were no vitals taken for this visit.  Physical Exam  Results: No results found for this or any previous visit (from the past 24 hour(s)).   Assessment/Plan: No  diagnosis found.    No orders of the defined types were placed in this encounter.     No follow-ups on file.  Breven Guidroz B. Samaira Holzworth, PA-C 01/19/2020 4:13 PM

## 2020-01-20 ENCOUNTER — Ambulatory Visit: Payer: Self-pay | Admitting: Obstetrics and Gynecology

## 2020-02-12 IMAGING — CT CT ANGIO CHEST
2 of 7 series · 13 of 36 positions shown · IV contrast (omnipaque)
Comparison: 06/08/2009 chest CT

CLINICAL DATA: 28-year-old female with acute chest pain. History of
mold exposure for 7 months.

EXAM:
CT ANGIOGRAPHY CHEST WITH CONTRAST
TECHNIQUE: Multidetector CT imaging of the chest was performed using the
standard protocol during bolus administration of intravenous
contrast. Multiplanar CT image reconstructions and MIPs were
obtained to evaluate the vascular anatomy.
CONTRAST:  75mL OMNIPAQUE IOHEXOL 350 MG/ML SOLN

[Series 5: thins cta pulmonary · axial · 0.67mm/px · z∈[-1123,-884]mm · 12 of 283 slices shown]
[im 22/283  lung]
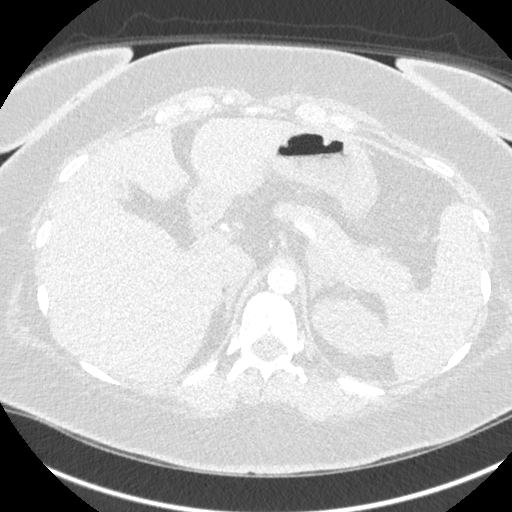
[im 44/283  mediastinal]
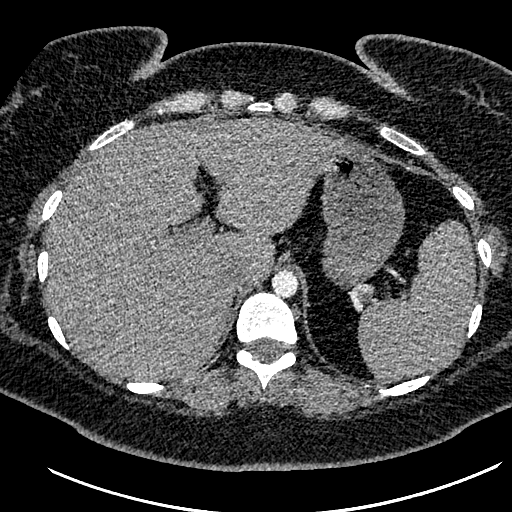
[im 66/283  lung]
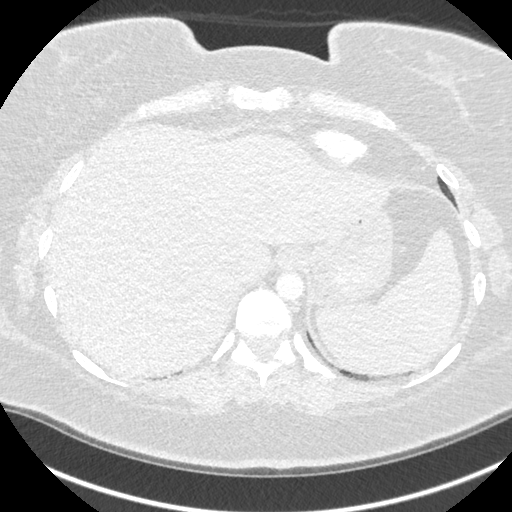
[im 87/283  mediastinal]
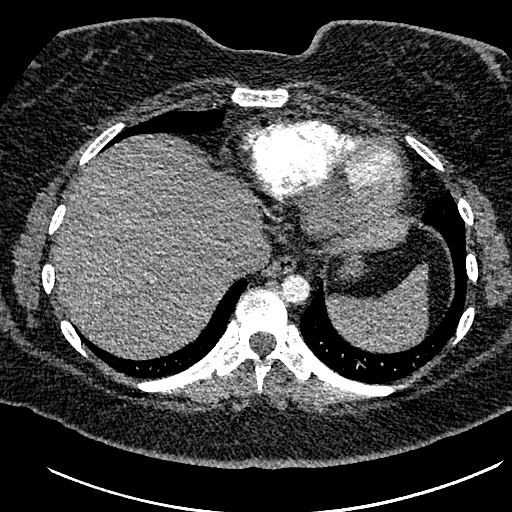
[im 109/283  lung]
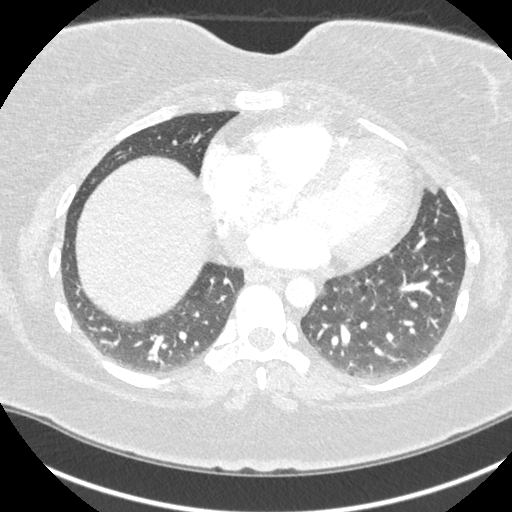
[im 131/283  mediastinal]
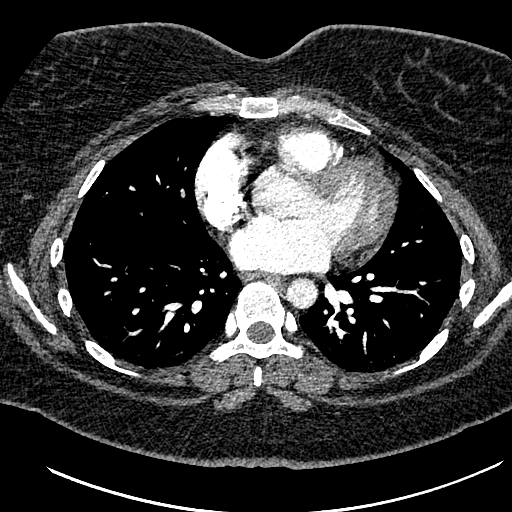
[im 152/283  lung]
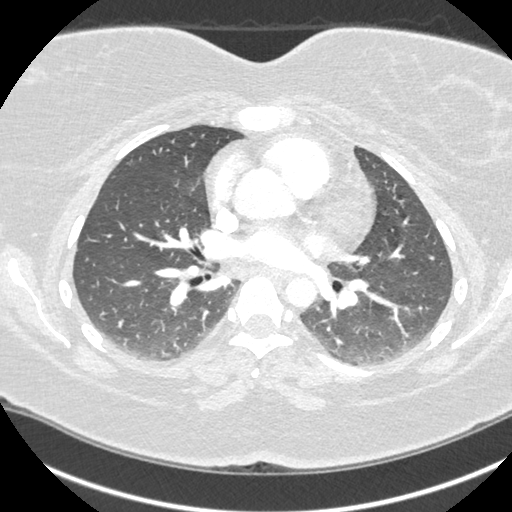
[im 174/283  mediastinal]
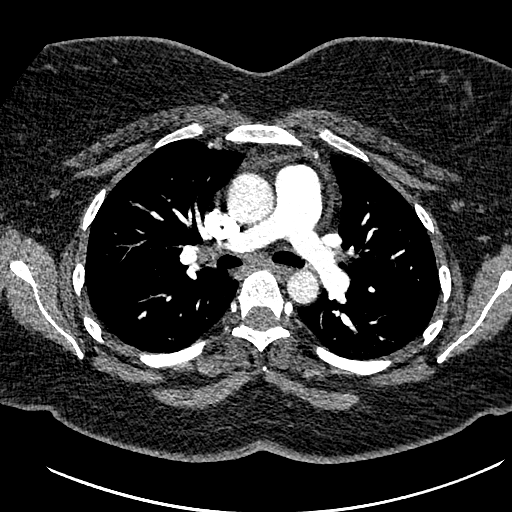
[im 196/283  lung]
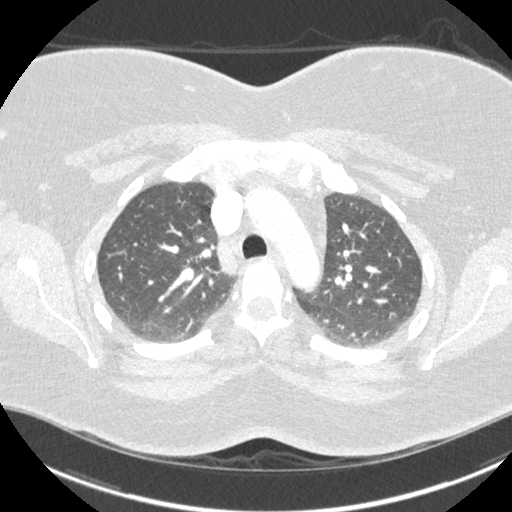
[im 217/283  mediastinal]
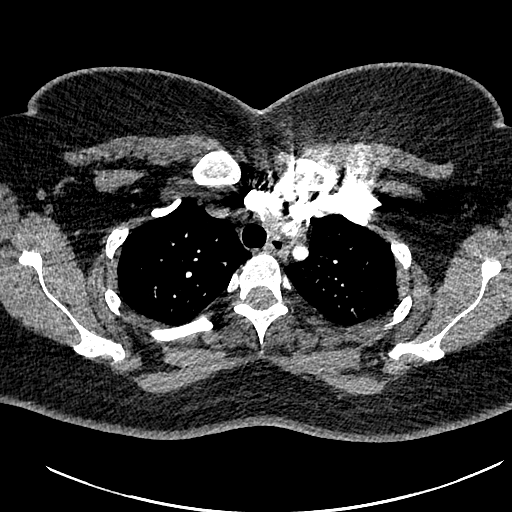
[im 239/283  lung]
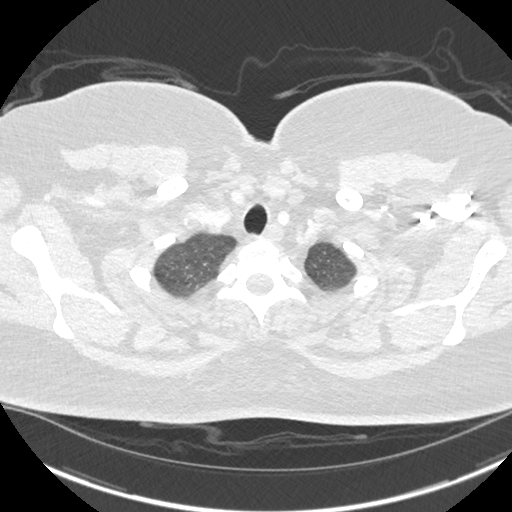
[im 261/283  mediastinal]
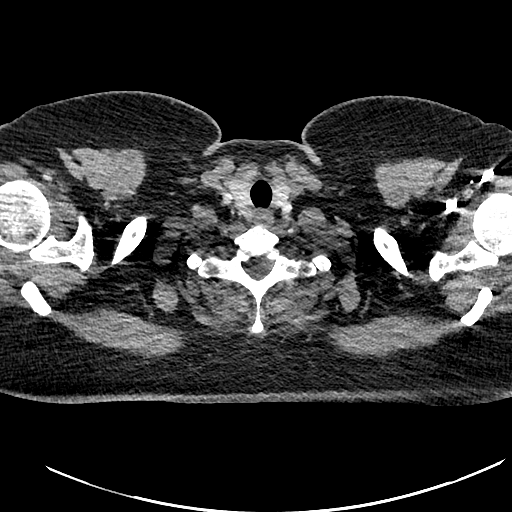

[Series 7: cta pulmonary · coronal · 0.55mm/px · 1 of 149 slices shown]
[im 75/149  mediastinal]
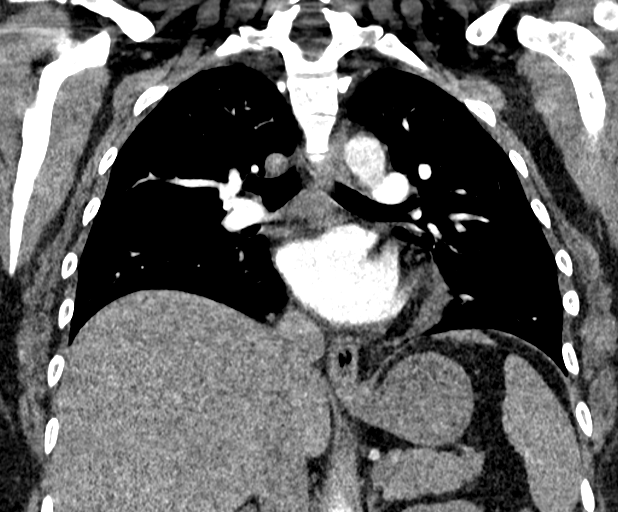

[13 of 36 positions shown; findings below may reference images not displayed]

FINDINGS: Cardiovascular: This is a technically satisfactory study for
evaluation of pulmonary emboli.

No pulmonary emboli are identified. No thoracic aortic aneurysm
noted. UPPER limits of normal heart size noted. No pericardial
effusion.

Mediastinum/Nodes: No enlarged mediastinal, hilar, or axillary lymph
nodes. Thyroid gland, trachea, and esophagus demonstrate no
significant findings.

Lungs/Pleura: No airspace disease, consolidation, nodule, mass,
pleural effusion or pneumothorax noted. No endobronchial or
endotracheal lesions are identified.

Upper Abdomen: No acute abnormality. Probable mild hepatic steatosis
noted.

Musculoskeletal: Unremarkable.

Review of the MIP images confirms the above findings.
IMPRESSION: 1. No evidence of pulmonary emboli or thoracic aortic aneurysm.
2. UPPER limits of normal heart size.
3. Question mild hepatic steatosis.

## 2020-02-18 ENCOUNTER — Institutional Professional Consult (permissible substitution): Admit: 2020-02-18 | Discharge: 2020-02-19

## 2020-02-18 DIAGNOSIS — J40 Bronchitis, not specified as acute or chronic: Principal | ICD-10-CM

## 2020-02-18 DIAGNOSIS — J309 Allergic rhinitis, unspecified: Principal | ICD-10-CM

## 2020-02-18 MED ORDER — ALBUTEROL SULFATE HFA 90 MCG/ACTUATION AEROSOL INHALER
Freq: Four times a day (QID) | RESPIRATORY_TRACT | 0 refills | 0 days | Status: CP | PRN
Start: 2020-02-18 — End: 2020-03-13

## 2020-02-18 MED ORDER — BENZONATATE 200 MG CAPSULE
ORAL_CAPSULE | Freq: Three times a day (TID) | ORAL | 0 refills | 7 days | Status: CP | PRN
Start: 2020-02-18 — End: 2020-02-25

## 2020-02-18 MED ORDER — AZITHROMYCIN 250 MG TABLET
ORAL_TABLET | 0 refills | 0 days | Status: CP
Start: 2020-02-18 — End: ?

## 2020-02-18 MED ORDER — METHYLPREDNISOLONE 4 MG TABLETS IN A DOSE PACK
0 refills | 0 days | Status: CP
Start: 2020-02-18 — End: ?

## 2020-03-13 DIAGNOSIS — J40 Bronchitis, not specified as acute or chronic: Principal | ICD-10-CM

## 2020-03-13 MED ORDER — ALBUTEROL SULFATE HFA 90 MCG/ACTUATION AEROSOL INHALER
Freq: Four times a day (QID) | RESPIRATORY_TRACT | 1 refills | 0 days | Status: CP | PRN
Start: 2020-03-13 — End: 2021-03-13

## 2020-04-25 ENCOUNTER — Telehealth: Payer: Self-pay

## 2020-04-25 NOTE — Telephone Encounter (Signed)
Pt calling; has yeast inf; wants rx sent to [facility] in [place] and not have to come in for appt.  [phone]

## 2020-04-26 NOTE — Telephone Encounter (Signed)
Patient is scheduled for 04/27/20 with JEG

## 2020-04-27 ENCOUNTER — Other Ambulatory Visit: Payer: Self-pay

## 2020-04-27 ENCOUNTER — Other Ambulatory Visit (HOSPITAL_COMMUNITY)
Admission: RE | Admit: 2020-04-27 | Discharge: 2020-04-27 | Disposition: A | Discharge status: Home / Self Care | Payer: Self-pay | Source: Ambulatory Visit | Attending: Obstetrics and Gynecology | Admitting: Obstetrics and Gynecology

## 2020-04-27 ENCOUNTER — Ambulatory Visit: Payer: Self-pay | Admitting: Obstetrics and Gynecology

## 2020-04-27 ENCOUNTER — Encounter: Payer: Self-pay | Admitting: Advanced Practice Midwife

## 2020-04-27 ENCOUNTER — Ambulatory Visit (INDEPENDENT_AMBULATORY_CARE_PROVIDER_SITE_OTHER): Payer: Self-pay | Admitting: Advanced Practice Midwife

## 2020-04-27 VITALS — BP 124/74 | Ht 70.0 in | Wt 293.0 lb

## 2020-04-27 DIAGNOSIS — N920 Excessive and frequent menstruation with regular cycle: Secondary | ICD-10-CM

## 2020-04-27 DIAGNOSIS — N946 Dysmenorrhea, unspecified: Secondary | ICD-10-CM

## 2020-04-27 DIAGNOSIS — N898 Other specified noninflammatory disorders of vagina: Secondary | ICD-10-CM | POA: Insufficient documentation

## 2020-04-27 DIAGNOSIS — R490 Dysphonia: Secondary | ICD-10-CM | POA: Insufficient documentation

## 2020-04-27 MED ORDER — FLUCONAZOLE 150 MG PO TABS: 150.0000 mg | ORAL_TABLET | Freq: Once | ORAL | 3 refills | Status: AC

## 2020-04-27 NOTE — Progress Notes (Signed)
Patient ID: Marissa Savage, female   DOB: 12-24-1990, 30 y.o.   MRN: [id number]  Reason for Consult: Vaginitis   Subjective:  HPI:  Marissa Savage is a 30 y.o. female being seen for symptoms of vaginitis. Her symptoms began 1 week ago. She has itching and discharge that she describes as white and thick. She also has odor that is "foul smelling". She has a history of yeast infections. She denies concern for STDs. She does not use birth control as she is in a same sex relationship. She reports having a normal PAP smear in 2020 at [facility]. She denies UTI symptoms.  She also mentions that her periods have generally- over the years- and specifically in the past year- gotten worse. They are heavier and more painful. She has a monthly period. Some months they last 4 days and are heavy the whole time and other months they last 9 days and are heavy about half the time. She describes the pain as debilitating and she is changing a super tampon every 45 minutes. She does not like to take NSAIDs due to her chronic gastritis and she prefers not to take hormones to regulate. She is interested in having an ultrasound to determine if she has fibroids. We also discussed endometriosis diagnosing/management.   Past Medical History:  Diagnosis Date  . Asthma   . Enlarged lymph nodes   . Mold exposure 09/2018  . Vocal cord dysfunction    Family History  Problem Relation Age of Onset  . Breast cancer Maternal Aunt   . Leukemia Maternal Grandfather   . Stomach cancer Paternal Grandmother   . Pancreatic cancer Paternal Grandfather    Past Surgical History:  Procedure Laterality Date  . LIVER BIOPSY  2020  . WISDOM TOOTH EXTRACTION      Short Social History:  Social History   Tobacco Use  . Smoking status: Never Smoker  . Smokeless tobacco: Never Used  Substance Use Topics  . Alcohol use: Yes    Comment: occ    Allergies  Allergen Reactions  . Ciprofloxacin   . Shellfish Allergy     Tight  chest, flushing in face  . Zyrtec [Cetirizine]     Current Outpatient Medications  Medication Sig Dispense Refill  . fluconazole (DIFLUCAN) 150 MG tablet Take 1 tablet (150 mg total) by mouth once for 1 dose. Can take additional dose three days later if symptoms persist 1 tablet 3  . albuterol (VENTOLIN HFA) 108 (90 Base) MCG/ACT inhaler Inhale into the lungs.    . dicyclomine (BENTYL) 20 MG tablet Take by mouth.    . fluticasone (FLOVENT HFA) 110 MCG/ACT inhaler Inhale into the lungs.     No current facility-administered medications for this visit.    Review of Systems  Constitutional: Negative for chills and fever.  HENT: Negative for congestion, ear discharge, ear pain, hearing loss, sinus pain and sore throat.   Eyes: Negative for blurred vision and double vision.  Respiratory: Negative for cough, shortness of breath and wheezing.   Cardiovascular: Negative for chest pain, palpitations and leg swelling.  Gastrointestinal: Negative for abdominal pain, blood in stool, constipation, diarrhea, heartburn, melena, nausea and vomiting.  Genitourinary: Negative for dysuria, flank pain, frequency, hematuria and urgency.       Positive for vaginal itching, discharge and odor  Musculoskeletal: Negative for back pain, joint pain and myalgias.  Skin: Negative for itching and rash.  Neurological: Negative for dizziness, tingling, tremors, sensory change, speech  change, focal weakness, seizures, loss of consciousness, weakness and headaches.  Endo/Heme/Allergies: Negative for environmental allergies. Does not bruise/bleed easily.       Positive for heavy painful periods  Psychiatric/Behavioral: Negative for depression, hallucinations, memory loss, substance abuse and suicidal ideas. The patient is not nervous/anxious and does not have insomnia.         Objective:  Objective   Vitals:   04/27/20 1456  BP: 124/74  Weight: 293 lb (132.9 kg)  Height: 5\' 10"  (1.778 m)   Body mass index is  42.04 kg/m. Constitutional: Well nourished, well developed female in no acute distress.  HEENT: normal Skin: Warm and dry.  Cardiovascular: Regular rate and rhythm.   Respiratory: Clear to auscultation bilateral. Normal respiratory effort Neuro: DTRs 2+, Cranial nerves grossly intact Psych: Alert and Oriented x3. No memory deficits. Normal mood and affect.  MS: normal gait, normal bilateral lower extremity ROM/strength/stability.   Pelvic exam:  is not limited by body habitus EGBUS: within normal limits Vagina: within normal limits and with normal mucosa, white discharge with some viscosity  Cervix: not evaluated   Assessment/Plan:     30 y.o. G0 P0 female with likely yeast infection, dysmenorrhea, menorrhagia  Aptima- vaginitis Rx Diflucan Follow up after lab results as needed NSAIDs as able to tolerate on worst days of cycle Gyn Ultrasound and f/u visit   26 CNM [facility] 04/27/2020, 4:33 PM

## 2020-04-27 NOTE — Patient Instructions (Addendum)
Endometriosis  Endometriosis is a condition in which a tissue similar to the endometrium grows in places outside the uterus. The endometrium is a tissue that forms the lining of the uterus. This tissue can grow in the organs that create the eggs (ovaries), the tubes that carry the eggs to the uterus (fallopian tubes), the vagina, and the bowel. This tissue most often grows on the ovaries and inner lining of the pelvic cavity (peritoneum). When the uterus sheds the endometrium every menstrual cycle, there is bleeding wherever these types of tissue are located. This can cause pain because blood is irritating to tissues that are not normally exposed to it. Endometriosis can also make it harder for a woman to get pregnant. What are the causes? The cause of this condition is not known. What increases the risk? The following factors may make you more likely to develop this condition:  Having a family history of endometriosis.  Having never given birth.  Starting your period at 10 years of age or younger. What are the signs or symptoms? Often, there are no symptoms of this condition. If you do have symptoms, they may:  Vary depending on where the abnormal tissue is growing.  Occur during your menstrual period (most often) or at the middle of your cycle.  Come and go. You may have no symptoms during some months.  Stop when you no longer have your monthly periods (menopause). Symptoms may include:  Pain in the area between your hip bones (pelvis).  Heavier bleeding during periods.  Menstrual periods that happen more than once a month.  Pain during sex.  Pain in the back or abdomen.  Painful bowel movements.  Not being able to get pregnant. How is this diagnosed? This condition is diagnosed based on your symptoms and a physical exam. You may have tests, such as:  Blood tests and urine tests to help rule out other causes.  Ultrasound to look for tissues that are not normal. This is  often done over your skin. It is sometimes done through the vagina (transvaginal).  X-ray of the lower bowel (barium enema).  CT scan.  MRI. To confirm the diagnosis, your health care provider may use a device with a small camera to check tissue inside your abdomen (laparoscopy). Abnormal tissue may be removed and checked in a lab (biopsy). How is this treated? There is no cure for this condition. Treatment focuses on controlling your symptoms. The type of treatment also depends on whether you want to become pregnant in the future. This condition may be treated with:  Medicines. These may include: ? Medicines to relieve pain, including NSAIDs such as ibuprofen. ? Hormone therapy. This uses artificial hormones to slow the growth of the abnormal tissue. This may include hormonal birth control, such as pills.  Surgery to remove the abnormal tissue. During surgery: ? Tissue may be removed using a laparoscope and a laser (laparoscopic laser treatment). ? The fallopian tubes, uterus, and ovaries may be removed (hysterectomy). This is done in very severe cases. Follow these instructions at home:  Get regular exercise.  Limit alcohol use.  Eat a balanced diet.  Avoid caffeine.  Take over-the-counter and prescription medicines only as told by your health care provider.  Keep all follow-up visits as told by your health care provider. This is important. Where to find more information  [employer]nec[employer]: https://www.acog.org/  Office on Women's Health: https://www.womenshealth.gov/ Contact a health care provider if:  You are having new   pain or trouble controlling pain.  You have problems getting pregnant.  You have a fever. Get help right away if you have:  Severe pain that does not get better with medicine.  Severe nausea and vomiting, or if you cannot eat or drink without vomiting.  Pain that affects your abdomen only on the lower, right  side.  Pain in your abdomen that gets worse.  Swelling in your abdomen.  Blood in your stool (feces). Summary  Endometriosis is a condition in which a tissue similar to the endometrium grows in places outside the uterus. The endometrium is a tissue that forms the lining of the uterus.  The cause of this condition is not known.  This condition may be treated with medicines to relieve pain, hormone therapy, or surgery.  If you have this condition, get regular exercise, limit alcohol use, and avoid caffeine.  Get help right away if you have severe pain that does not get better with medicine, or if you have severe nausea and vomiting or blood in your stool. This information is not intended to replace advice given to you by your health care provider. Make sure you discuss any questions you have with your health care provider. Document Revised: 04/14/2019 Document Reviewed: 04/14/2019 Elsevier Patient Education  2021 [employer] Vaginitis  Vaginitis is a condition in which the vaginal tissue swells and becomes irritated. This condition is most often caused by a change in the normal balance of bacteria and yeast that live in the vagina. This change causes an overgrowth of certain bacteria or yeast, which causes the inflammation. There are different types of vaginitis. What are the causes? The cause of this condition depends on the type of vaginitis. It can be caused by:  Bacteria (bacterial vaginosis).  Yeast, which is a fungus (candidiasis).  A parasite (trichomoniasis vaginitis).  A virus (viral vaginitis).  Low hormone levels (atrophic vaginitis). Low hormone levels can occur during pregnancy, breastfeeding, or after menopause.  Irritants, such as bubble baths, scented tampons, and feminine sprays (allergic vaginitis). Other factors can change the normal balance of the yeast and bacteria that live in the vagina. These include:  Antibiotic medicines.  Poor  hygiene.  Diaphragms, vaginal sponges, spermicides, birth control pills, and intrauterine devices (IUDs).  Sex.  Infection.  Uncontrolled diabetes.  A weakened body defense system (immune system). What increases the risk? This condition is more likely to develop in women who:  Smoke or are exposed to secondhand smoke.  Use vaginal douches, scented tampons, or scented sanitary pads.  Wear tight-fitting pants or thong underwear.  Use oral birth control pills or an IUD.  Have sex without a condom or have multiple partners.  Have an STI.  Frequently use the spermicide nonoxynol-9.  Eat lots of foods high in sugar or who have uncontrolled diabetes.  Have low estrogen levels.  Have a weakened immune system from an immune disorder or medical treatment.  Are pregnant or breastfeeding. What are the signs or symptoms? Symptoms vary depending on the cause of the vaginitis. Common symptoms include:  Abnormal vaginal discharge. ? The discharge is white, gray, or yellow with bacterial vaginosis. ? The discharge is thick, white, and cheesy with a yeast infection. ? The discharge is frothy and yellow or greenish with trichomoniasis.  A bad vaginal smell. The smell is fishy with bacterial vaginosis.  Vaginal itching, pain, or swelling.  Pain with sex.  Pain or burning when urinating. Sometimes there are no symptoms. How is this diagnosed?  This condition is diagnosed based on your symptoms and medical history. A physical exam, including a pelvic exam, will also be done. You may also have other tests, including:  Tests to determine the pH level (acidity or alkalinity) of your vagina.  A whiff test to assess the odor that results when a sample of your vaginal discharge is mixed with a potassium hydroxide solution.  Tests of vaginal fluid. A sample will be examined under a microscope. How is this treated? Treatment varies depending on the type of vaginitis you have. Your  treatment may include:  Antibiotic creams or pills to treat bacterial vaginosis and trichomoniasis.  Antifungal medicines, such as vaginal creams or suppositories, to treat a yeast infection.  Medicine to ease discomfort if you have viral vaginitis. Your sexual partner should also be treated.  Estrogen delivered in a cream, pill, suppository, or vaginal ring to treat atrophic vaginitis. If vaginal dryness occurs, lubricants and moisturizing creams may help. You may need to avoid scented soaps, sprays, or douches.  Stopping use of a product that is causing allergic vaginitis and then using a vaginal cream to treat the symptoms. Follow these instructions at home: Lifestyle  Keep your genital area clean and dry. Avoid soap, and only rinse the area with water.  Do not douche or use tampons until your health care provider says it is okay. Use sanitary pads, if needed.  Do not have sex until your health care provider approves. When you can return to sex, practice safe sex and use condoms.  Wipe from front to back. This avoids the spread of bacteria from the rectum to the vagina. General instructions  Take over-the-counter and prescription medicines only as told by your health care provider.  If you were prescribed an antibiotic medicine, take or use it as told by your health care provider. Do not stop taking or using the antibiotic even if you start to feel better.  Keep all follow-up visits. This is important. How is this prevented?  Use mild, unscented products. Do not use things that can irritate the vagina, such as fabric softeners. Avoid the following products if they are scented: ? Feminine sprays. ? Detergents. ? Tampons. ? Feminine hygiene products. ? Soaps or bubble baths.  Let air reach your genital area. To do this: ? Wear cotton underwear to reduce moisture buildup. ? Avoid wearing underwear while you sleep. ? Avoid wearing tight pants and underwear or nylons without a  cotton panel. ? Avoid wearing thong underwear.  Take off any wet clothing, such as bathing suits, as soon as possible.  Practice safe sex and use condoms. Contact a health care provider if:  You have abdominal or pelvic pain.  You have a fever or chills.  You have symptoms that last for more than 2-3 days. Get help right away if:  You have a fever and your symptoms suddenly get worse. Summary  Vaginitis is a condition in which the vaginal tissue becomes inflamed.This condition is most often caused by a change in the normal balance of bacteria and yeast that live in the vagina.  Treatment varies depending on the type of vaginitis you have.  Do not douche, use tampons, or have sex until your health care provider approves. When you can return to sex, practice safe sex and use condoms. This information is not intended to replace advice given to you by your health care provider. Make sure you discuss any questions you have with your health care provider. Document Revised:  08/26/2019 Document Reviewed: 08/26/2019 Elsevier Patient Education  2021 [employer].

## 2020-05-01 LAB — CERVICOVAGINAL ANCILLARY ONLY
Bacterial Vaginitis (gardnerella): POSITIVE — AB
Candida Glabrata: NEGATIVE
Candida Vaginitis: NEGATIVE
Comment: NEGATIVE
Comment: NEGATIVE
Comment: NEGATIVE

## 2020-05-04 ENCOUNTER — Other Ambulatory Visit: Payer: Self-pay | Admitting: Advanced Practice Midwife

## 2020-05-04 DIAGNOSIS — B9689 Other specified bacterial agents as the cause of diseases classified elsewhere: Secondary | ICD-10-CM

## 2020-05-04 MED ORDER — METRONIDAZOLE 0.75 % VA GEL: 1.0000 | Freq: Every day | VAGINAL | 1 refills | Status: AC

## 2020-05-04 NOTE — Progress Notes (Signed)
Rx metro gel sent to treat bv. Message sent to patient.

## 2020-05-04 NOTE — Telephone Encounter (Signed)
Can you send in a rx?

## 2020-05-14 ENCOUNTER — Encounter: Payer: Self-pay | Admitting: Emergency Medicine

## 2020-05-14 ENCOUNTER — Ambulatory Visit
Admission: EM | Admit: 2020-05-14 | Discharge: 2020-05-14 | Disposition: A | Discharge status: Home / Self Care | Payer: Self-pay | Attending: Physician Assistant | Admitting: Physician Assistant

## 2020-05-14 ENCOUNTER — Other Ambulatory Visit: Payer: Self-pay

## 2020-05-14 DIAGNOSIS — R059 Cough, unspecified: Secondary | ICD-10-CM

## 2020-05-14 DIAGNOSIS — J4520 Mild intermittent asthma, uncomplicated: Secondary | ICD-10-CM | POA: Insufficient documentation

## 2020-05-14 DIAGNOSIS — R509 Fever, unspecified: Secondary | ICD-10-CM | POA: Insufficient documentation

## 2020-05-14 DIAGNOSIS — Z20822 Contact with and (suspected) exposure to covid-19: Secondary | ICD-10-CM | POA: Insufficient documentation

## 2020-05-14 DIAGNOSIS — B349 Viral infection, unspecified: Secondary | ICD-10-CM | POA: Insufficient documentation

## 2020-05-14 DIAGNOSIS — R0981 Nasal congestion: Secondary | ICD-10-CM | POA: Insufficient documentation

## 2020-05-14 DIAGNOSIS — H6123 Impacted cerumen, bilateral: Secondary | ICD-10-CM | POA: Insufficient documentation

## 2020-05-14 LAB — SARS CORONAVIRUS 2 (TAT 6-24 HRS): SARS Coronavirus 2: NEGATIVE

## 2020-05-14 LAB — RAPID INFLUENZA A&B ANTIGENS
Influenza A (ARMC): NEGATIVE
Influenza B (ARMC): NEGATIVE

## 2020-05-14 MED ORDER — IBUPROFEN 800 MG PO TABS: 800.0000 mg | ORAL_TABLET | Freq: Three times a day (TID) | ORAL | 0 refills | Status: AC | PRN

## 2020-05-14 MED ORDER — BENZONATATE 100 MG PO CAPS: 200.0000 mg | ORAL_CAPSULE | Freq: Three times a day (TID) | ORAL | 0 refills | Status: AC | PRN

## 2020-05-14 NOTE — ED Provider Notes (Signed)
[facility]    CSN: [id number] Arrival date & time: 05/14/20  0813      History   Chief Complaint Chief Complaint  Patient presents with  . Fever  . Cough    HPI Marissa Savage is a 30 y.o. female presenting for approximately 4-day history of fatigue, body aches, cough and congestion, right-sided ear pain and nasal congestion.  Patient states cough is productive of greenish mucus.  She also states she has had fevers up to 100.6 degrees and states that is what her temperature was this morning.  She did not take anything for the fever today.  Temperature in clinic is 98.9.  No sick contacts.  Reports history of positive COVID-19 infection in January 2022 and also in August 2021.  She is not vaccinated for COVID-19.  She does have history of vocal cord dysfunction, and enlarged lymph node on the right side.  Patient also has chronic stomach ulcers and hepatic steatosis.  She is not taking any medication for her symptoms.  Patient states that she just feels "not like myself."  No other concerns.  HPI  Past Medical History:  Diagnosis Date  . Asthma   . Enlarged lymph nodes   . Mold exposure 09/2018  . Vocal cord dysfunction     Patient Active Problem List   Diagnosis Date Noted  . Dysphonia 04/27/2020  . Elevated LFTs 12/02/2018  . Hepatic steatosis 12/02/2018  . Mild intermittent asthma without complication 10/22/2018  . Chronic stomach ulcer 05/14/2018    Past Surgical History:  Procedure Laterality Date  . LIVER BIOPSY  2020  . WISDOM TOOTH EXTRACTION      OB History    Gravida  0   Para  0   Term  0   Preterm  0   AB  0   Living  0     SAB  0   IAB  0   Ectopic  0   Multiple  0   Live Births  0            Home Medications    Prior to Admission medications   Medication Sig Start Date End Date Taking? Authorizing Provider  benzonatate (TESSALON) 100 MG capsule Take 2 capsules (200 mg total) by mouth 3 (three) times daily as  needed for up to 7 days for cough. 05/14/20 05/21/20 Yes Shirlee Latch, PA-C  ibuprofen (ADVIL) 800 MG tablet Take 1 tablet (800 mg total) by mouth every 8 (eight) hours as needed for up to 7 days for fever, headache or moderate pain. 05/14/20 05/21/20 Yes Eusebio Friendly B, PA-C  albuterol (VENTOLIN HFA) 108 (90 Base) MCG/ACT inhaler Inhale into the lungs. 10/19/18   [provider]  dicyclomine (BENTYL) 20 MG tablet Take by mouth. 10/22/18 10/22/19  [provider]  fluticasone (FLOVENT HFA) 110 MCG/ACT inhaler Inhale into the lungs.    [provider]    Family History Family History  Problem Relation Age of Onset  . Breast cancer Maternal Aunt   . Leukemia Maternal Grandfather   . Stomach cancer Paternal Grandmother   . Pancreatic cancer Paternal Grandfather     Social History Social History   Tobacco Use  . Smoking status: Never Smoker  . Smokeless tobacco: Never Used  Vaping Use  . Vaping Use: Never used  Substance Use Topics  . Alcohol use: Yes    Comment: occ  . Drug use: Not Currently    Types: Marijuana  Allergies   Ciprofloxacin, Shellfish allergy, and Zyrtec [cetirizine]   Review of Systems Review of Systems  Constitutional: Positive for fatigue and fever. Negative for chills and diaphoresis.  HENT: Positive for congestion, ear pain and rhinorrhea. Negative for sinus pressure, sinus pain and sore throat.   Respiratory: Positive for cough. Negative for shortness of breath.   Cardiovascular: Negative for chest pain.  Gastrointestinal: Negative for abdominal pain, nausea and vomiting.  Musculoskeletal: Positive for myalgias. Negative for arthralgias.  Skin: Negative for rash.  Neurological: Positive for headaches. Negative for weakness.  Hematological: Negative for adenopathy.     Physical Exam Triage Vital Signs ED Triage Vitals  Enc Vitals Group     BP 05/14/20 0824 116/76     Pulse Rate 05/14/20 0824 (!) 110     Resp 05/14/20  0824 14     Temp 05/14/20 0824 98.9 F (37.2 C)     Temp Source 05/14/20 0824 Oral     SpO2 05/14/20 0824 99 %     Weight 05/14/20 0820 285 lb (129.3 kg)     Height 05/14/20 0820 5\' 10"  (1.778 m)     Head Circumference --      Peak Flow --      Pain Score 05/14/20 0820 5     Pain Loc --      Pain Edu? --      Excl. in GC? --    No data found.  Updated Vital Signs BP 116/76 (BP Location: Left Arm)   Pulse (!) 110   Temp 98.9 F (37.2 C) (Oral)   Resp 14   Ht 5\' 10"  (1.778 m)   Wt 285 lb (129.3 kg)   LMP 04/17/2020 (Exact Date)   SpO2 99%   BMI 40.89 kg/m       Physical Exam Vitals and nursing note reviewed.  Constitutional:      General: She is not in acute distress.    Appearance: Normal appearance. She is ill-appearing. She is not toxic-appearing.  HENT:     Head: Normocephalic and atraumatic.     Right Ear: External ear normal. There is impacted cerumen.     Left Ear: External ear normal. There is impacted cerumen.     Nose: Congestion and rhinorrhea present.     Mouth/Throat:     Mouth: Mucous membranes are moist.     Pharynx: Oropharynx is clear.  Eyes:     General: No scleral icterus.       Right eye: No discharge.        Left eye: No discharge.     Conjunctiva/sclera: Conjunctivae normal.  Cardiovascular:     Rate and Rhythm: Regular rhythm. Tachycardia present.     Heart sounds: Normal heart sounds.  Pulmonary:     Effort: Pulmonary effort is normal. No respiratory distress.     Breath sounds: Normal breath sounds.  Musculoskeletal:     Cervical back: Neck supple.  Skin:    General: Skin is dry.  Neurological:     General: No focal deficit present.     Mental Status: She is alert. Mental status is at baseline.     Motor: No weakness.     Gait: Gait normal.  Psychiatric:        Mood and Affect: Mood normal.        Behavior: Behavior normal.        Thought Content: Thought content normal.      UC Treatments / Results  Labs (  all labs  ordered are listed, but only abnormal results are displayed) Labs Reviewed  RAPID INFLUENZA A&B ANTIGENS  SARS CORONAVIRUS 2 (TAT 6-24 HRS)    EKG   Radiology No results found.  Procedures Procedures (including critical care time)  Medications Ordered in UC Medications - No data to display  Initial Impression / Assessment and Plan / UC Course  I have reviewed the triage vital signs and the nursing notes.  Pertinent labs & imaging results that were available during my care of the patient were reviewed by me and considered in my medical decision making (see chart for details).   30 year old female presenting for multiple symptoms.  Reports low-grade fever, body aches, fatigue, cough, congestion, headaches, right-sided ear pain x4 days.  Normal temperature in clinic.  Pulse elevated at 110.  Other vital signs are normal.  Exam significant for mildly ill-appearing female, minor nasal congestion and clear rhinorrhea, tachycardia, bilateral impacted cerumen.  Chest is clear to auscultation.  Rapid flu test obtained. Negative result Send out COVID-19 test obtained.  Patient to have bilateral otic lavage performed due to bilateral cerumen impaction.  I suspect this is the cause of right-sided ear pain, but will reassess after lavage.  Otic lavage somewhat successful.  It was successful in the left ear, and moderately in the right ear.  Patient still has impacted cerumen I cannot see her right TM.  Advised her that she will need to make an appointment with ENT specialist for them to remove the wax.  Advised using Debrox solution until then.  Advised patient she likely has a viral infection, possibly but unlikely COVID-19 given that she has had this infection twice in the past.  CDC guidelines, isolation protocol and ED precautions reviewed patient.  I did send Tessalon Perles and ibuprofen 800 mg tablets to pharmacy.  Advised increasing rest and fluids.  Advised patient to follow-up with her  PCP regarding her elevated heart rate and suggested she cut back on caffeine in the meantime and try to manage her stress.  Advised ED for any chest pain, palpitations or trouble breathing.  Work note provided.   Final Clinical Impressions(s) / UC Diagnoses   Final diagnoses:  Viral illness  Cough  Nasal congestion  Bilateral impacted cerumen     Discharge Instructions     URI/COLD SYMPTOMS: Your exam today is consistent with a viral illness. Negative flu test. Antibiotics are not indicated at this time. Use medications as directed, including cough pills, nasal saline, and decongestants. Your symptoms should improve over the next few days and resolve within 7-10 days. Increase rest and fluids. F/u if symptoms worsen or predominate such as sore throat, ear pain, productive cough, shortness of breath, or if you develop high fevers or worsening fatigue over the next several days.  Call ENT for appointment as you may need them to clean the right ear out. Use debrox drops until then.  You have received COVID testing today either for positive exposure, concerning symptoms that could be related to COVID infection, screening purposes, or re-testing after confirmed positive.  Your test obtained today checks for active viral infection in the last 1-2 weeks. If your test is negative now, you can still test positive later. So, if you do develop symptoms you should either get re-tested and/or isolate x 5 days and then strict mask use x 5 days (unvaccinated) or mask use x 10 days (vaccinated). Please follow CDC guidelines.  While Rapid antigen tests come back in 15-20  minutes, send out PCR/molecular test results typically come back within 1-3 days. In the mean time, if you are symptomatic, assume this could be a positive test and treat/monitor yourself as if you do have COVID.   We will call with test results if positive. Please download the MyChart app and set up a profile to access test results.   If  symptomatic, go home and rest. Push fluids. Take Tylenol as needed for discomfort. Gargle warm salt water. Throat lozenges. Take Mucinex DM or Robitussin for cough. Humidifier in bedroom to ease coughing. Warm showers. Also review the COVID handout for more information.  COVID-19 INFECTION: The incubation period of COVID-19 is approximately 14 days after exposure, with most symptoms developing in roughly 4-5 days. Symptoms may range in severity from mild to critically severe. Roughly 80% of those infected will have mild symptoms. People of any age may become infected with COVID-19 and have the ability to transmit the virus. The most common symptoms include: fever, fatigue, cough, body aches, headaches, sore throat, nasal congestion, shortness of breath, nausea, vomiting, diarrhea, changes in smell and/or taste.    COURSE OF ILLNESS Some patients may begin with mild disease which can progress quickly into critical symptoms. If your symptoms are worsening please call ahead to the Emergency Department and proceed there for further treatment. Recovery time appears to be roughly 1-2 weeks for mild symptoms and 3-6 weeks for severe disease.   GO IMMEDIATELY TO ER FOR FEVER YOU ARE UNABLE TO GET DOWN WITH TYLENOL, BREATHING PROBLEMS, CHEST PAIN, FATIGUE, LETHARGY, INABILITY TO EAT OR DRINK, ETC  QUARANTINE AND ISOLATION: To help decrease the spread of COVID-19 please remain isolated if you have COVID infection or are highly suspected to have COVID infection. This means -stay home and isolate to one room in the home if you live with others. Do not share a bed or bathroom with others while ill, sanitize and wipe down all countertops and keep common areas clean and disinfected. Stay home for 5 days. If you have no symptoms or your symptoms are resolving after 5 days, you can leave your house. Continue to wear a mask around others for 5 additional days. If you have been in close contact (within 6 feet) of someone  diagnosed with COVID 19, you are advised to quarantine in your home for 14 days as symptoms can develop anywhere from 2-14 days after exposure to the virus. If you develop symptoms, you  must isolate.  Most current guidelines for COVID after exposure -unvaccinated: isolate 5 days and strict mask use x 5 days. Test on day 5 is possible -vaccinated: wear mask x 10 days if symptoms do not develop -You do not necessarily need to be tested for COVID if you have + exposure and  develop symptoms. Just isolate at home x10 days from symptom onset During this global pandemic, CDC advises to practice social distancing, try to stay at least 68ft away from others at all times. Wear a face covering. Wash and sanitize your hands regularly and avoid going anywhere that is not necessary.  KEEP IN MIND THAT THE COVID TEST IS NOT 100% ACCURATE AND YOU SHOULD STILL DO EVERYTHING TO PREVENT POTENTIAL SPREAD OF VIRUS TO OTHERS (WEAR MASK, WEAR GLOVES, WASH HANDS AND SANITIZE REGULARLY). IF INITIAL TEST IS NEGATIVE, THIS MAY NOT MEAN YOU ARE DEFINITELY NEGATIVE. MOST ACCURATE TESTING IS DONE 5-7 DAYS AFTER EXPOSURE.   It is not advised by CDC to get re-tested after receiving a positive  COVID test since you can still test positive for weeks to months after you have already cleared the virus.   *If you have not been vaccinated for COVID, I strongly suggest you consider getting vaccinated as long as there are no contraindications.      ED Prescriptions    Medication Sig Dispense Auth. Provider   benzonatate (TESSALON) 100 MG capsule Take 2 capsules (200 mg total) by mouth 3 (three) times daily as needed for up to 7 days for cough. 21 capsule Eusebio Friendly B, PA-C   ibuprofen (ADVIL) 800 MG tablet Take 1 tablet (800 mg total) by mouth every 8 (eight) hours as needed for up to 7 days for fever, headache or moderate pain. 21 tablet Shirlee Latch, PA-C     PDMP not reviewed this encounter.   Shirlee Latch,  PA-C 05/14/20 [phone]

## 2020-05-14 NOTE — Discharge Instructions (Addendum)
URI/COLD SYMPTOMS: Your exam today is consistent with a viral illness. Negative flu test. Antibiotics are not indicated at this time. Use medications as directed, including cough pills, nasal saline, and decongestants. Your symptoms should improve over the next few days and resolve within 7-10 days. Increase rest and fluids. F/u if symptoms worsen or predominate such as sore throat, ear pain, productive cough, shortness of breath, or if you develop high fevers or worsening fatigue over the next several days.  Call ENT for appointment as you may need them to clean the right ear out. Use debrox drops until then.  You have received COVID testing today either for positive exposure, concerning symptoms that could be related to COVID infection, screening purposes, or re-testing after confirmed positive.  Your test obtained today checks for active viral infection in the last 1-2 weeks. If your test is negative now, you can still test positive later. So, if you do develop symptoms you should either get re-tested and/or isolate x 5 days and then strict mask use x 5 days (unvaccinated) or mask use x 10 days (vaccinated). Please follow CDC guidelines.  While Rapid antigen tests come back in 15-20 minutes, send out PCR/molecular test results typically come back within 1-3 days. In the mean time, if you are symptomatic, assume this could be a positive test and treat/monitor yourself as if you do have COVID.   We will call with test results if positive. Please download the MyChart app and set up a profile to access test results.   If symptomatic, go home and rest. Push fluids. Take Tylenol as needed for discomfort. Gargle warm salt water. Throat lozenges. Take Mucinex DM or Robitussin for cough. Humidifier in bedroom to ease coughing. Warm showers. Also review the COVID handout for more information.  COVID-19 INFECTION: The incubation period of COVID-19 is approximately 14 days after exposure, with most symptoms  developing in roughly 4-5 days. Symptoms may range in severity from mild to critically severe. Roughly 80% of those infected will have mild symptoms. People of any age may become infected with COVID-19 and have the ability to transmit the virus. The most common symptoms include: fever, fatigue, cough, body aches, headaches, sore throat, nasal congestion, shortness of breath, nausea, vomiting, diarrhea, changes in smell and/or taste.    COURSE OF ILLNESS Some patients may begin with mild disease which can progress quickly into critical symptoms. If your symptoms are worsening please call ahead to the Emergency Department and proceed there for further treatment. Recovery time appears to be roughly 1-2 weeks for mild symptoms and 3-6 weeks for severe disease.   GO IMMEDIATELY TO ER FOR FEVER YOU ARE UNABLE TO GET DOWN WITH TYLENOL, BREATHING PROBLEMS, CHEST PAIN, FATIGUE, LETHARGY, INABILITY TO EAT OR DRINK, ETC  QUARANTINE AND ISOLATION: To help decrease the spread of COVID-19 please remain isolated if you have COVID infection or are highly suspected to have COVID infection. This means -stay home and isolate to one room in the home if you live with others. Do not share a bed or bathroom with others while ill, sanitize and wipe down all countertops and keep common areas clean and disinfected. Stay home for 5 days. If you have no symptoms or your symptoms are resolving after 5 days, you can leave your house. Continue to wear a mask around others for 5 additional days. If you have been in close contact (within 6 feet) of someone diagnosed with COVID 19, you are advised to quarantine in your home  for 14 days as symptoms can develop anywhere from 2-14 days after exposure to the virus. If you develop symptoms, you  must isolate.  Most current guidelines for COVID after exposure -unvaccinated: isolate 5 days and strict mask use x 5 days. Test on day 5 is possible -vaccinated: wear mask x 10 days if symptoms do not  develop -You do not necessarily need to be tested for COVID if you have + exposure and  develop symptoms. Just isolate at home x10 days from symptom onset During this global pandemic, CDC advises to practice social distancing, try to stay at least 65ft away from others at all times. Wear a face covering. Wash and sanitize your hands regularly and avoid going anywhere that is not necessary.  KEEP IN MIND THAT THE COVID TEST IS NOT 100% ACCURATE AND YOU SHOULD STILL DO EVERYTHING TO PREVENT POTENTIAL SPREAD OF VIRUS TO OTHERS (WEAR MASK, WEAR GLOVES, WASH HANDS AND SANITIZE REGULARLY). IF INITIAL TEST IS NEGATIVE, THIS MAY NOT MEAN YOU ARE DEFINITELY NEGATIVE. MOST ACCURATE TESTING IS DONE 5-7 DAYS AFTER EXPOSURE.   It is not advised by CDC to get re-tested after receiving a positive COVID test since you can still test positive for weeks to months after you have already cleared the virus.   *If you have not been vaccinated for COVID, I strongly suggest you consider getting vaccinated as long as there are no contraindications.

## 2020-05-14 NOTE — ED Triage Notes (Signed)
Patient states that she had COVID in January.  Patient c/o cough, bodyaches, fatigue, fever, right ear pain, nasal congestion, and HA, chest congestion that started 4 days ago.

## 2020-05-15 ENCOUNTER — Encounter: Payer: Self-pay | Admitting: Emergency Medicine

## 2020-05-15 ENCOUNTER — Ambulatory Visit
Admission: EM | Admit: 2020-05-15 | Discharge: 2020-05-15 | Disposition: A | Discharge status: Home / Self Care | Payer: Self-pay

## 2020-05-15 ENCOUNTER — Other Ambulatory Visit: Payer: Self-pay

## 2020-05-15 DIAGNOSIS — B349 Viral infection, unspecified: Secondary | ICD-10-CM

## 2020-05-15 NOTE — Discharge Instructions (Addendum)
In for reevaluation if you develop any new or worsening symptoms.

## 2020-05-15 NOTE — ED Provider Notes (Signed)
[facility]    CSN: [id number] Arrival date & time: 05/15/20  1926      History   Chief Complaint Chief Complaint  Patient presents with  . Shortness of Breath    HPI Marissa Savage is a 30 y.o. female.   HPI   29 year old female here for evaluation of respiratory complaints.  Patient reports that she was to be evaluated because she has had a decreased appetite, some upper abdominal cramping, stabbing pain in lower back, feels like she cannot take in a deep breath and like her airways are being pinched off, a nonproductive cough, shortness of breath, wheezing, and chest pain.  Patient denies syncope, numbness and tingling around her mouth, cramping in her hands, or any asthma history.  Patient was evaluated yesterday in this clinic and had a negative flu and COVID test.  She has since taking 2 additional at home Covid test that have both been negative as well.  Patient denies any life or job stress increased at present though did report to the triage nurse that her father had recently had a major heart attack.  Past Medical History:  Diagnosis Date  . Asthma   . Enlarged lymph nodes   . Mold exposure 09/2018  . Vocal cord dysfunction     Patient Active Problem List   Diagnosis Date Noted  . Dysphonia 04/27/2020  . Elevated LFTs 12/02/2018  . Hepatic steatosis 12/02/2018  . Mild intermittent asthma without complication 10/22/2018  . Chronic stomach ulcer 05/14/2018    Past Surgical History:  Procedure Laterality Date  . LIVER BIOPSY  2020  . WISDOM TOOTH EXTRACTION      OB History    Gravida  0   Para  0   Term  0   Preterm  0   AB  0   Living  0     SAB  0   IAB  0   Ectopic  0   Multiple  0   Live Births  0            Home Medications    Prior to Admission medications   Medication Sig Start Date End Date Taking? Authorizing Provider  benzonatate (TESSALON) 100 MG capsule Take 2 capsules (200 mg total) by mouth 3 (three)  times daily as needed for up to 7 days for cough. 05/14/20 05/21/20 Yes Eusebio Friendly B, PA-C  albuterol (VENTOLIN HFA) 108 (90 Base) MCG/ACT inhaler Inhale into the lungs. 10/19/18   [provider]  dicyclomine (BENTYL) 20 MG tablet Take by mouth. 10/22/18 10/22/19  [provider]  fluticasone (FLOVENT HFA) 110 MCG/ACT inhaler Inhale into the lungs.    [provider]  ibuprofen (ADVIL) 800 MG tablet Take 1 tablet (800 mg total) by mouth every 8 (eight) hours as needed for up to 7 days for fever, headache or moderate pain. 05/14/20 05/21/20  Shirlee Latch, PA-C    Family History Family History  Problem Relation Age of Onset  . Breast cancer Maternal Aunt   . Leukemia Maternal Grandfather   . Stomach cancer Paternal Grandmother   . Pancreatic cancer Paternal Grandfather   . Healthy Mother   . Heart disease Father     Social History Social History   Tobacco Use  . Smoking status: Never Smoker  . Smokeless tobacco: Never Used  Vaping Use  . Vaping Use: Never used  Substance Use Topics  . Alcohol use: Not Currently    Comment:  occ  . Drug use: Not Currently    Types: Marijuana     Allergies   Ciprofloxacin, Shellfish allergy, and Zyrtec [cetirizine]   Review of Systems Review of Systems  Constitutional: Positive for appetite change and fever.  Respiratory: Positive for cough, shortness of breath and wheezing.   Cardiovascular: Positive for chest pain.  Gastrointestinal: Positive for abdominal pain. Negative for nausea and vomiting.  Neurological: Negative for syncope and numbness.  Hematological: Negative.   Psychiatric/Behavioral: Negative.      Physical Exam Triage Vital Signs ED Triage Vitals  Enc Vitals Group     BP --      Pulse --      Resp --      Temp --      Temp src --      SpO2 --      Weight 05/15/20 1937 280 lb (127 kg)     Height 05/15/20 1937 5\' 10"  (1.778 m)     Head Circumference --      Peak Flow --      Pain Score  05/15/20 1936 0     Pain Loc --      Pain Edu? --      Excl. in GC? --    No data found.  Updated Vital Signs BP 126/87 (BP Location: Left Arm)   Pulse (!) 102   Temp 98.3 F (36.8 C) (Oral)   Resp 18   Ht 5\' 10"  (1.778 m)   Wt 280 lb (127 kg)   LMP 04/17/2020   SpO2 100%   BMI 40.18 kg/m   Visual Acuity Right Eye Distance:   Left Eye Distance:   Bilateral Distance:    Right Eye Near:   Left Eye Near:    Bilateral Near:     Physical Exam Vitals and nursing note reviewed.  Constitutional:      General: She is not in acute distress.    Appearance: Normal appearance. She is normal weight. She is not ill-appearing.  HENT:     Head: Normocephalic and atraumatic.  Cardiovascular:     Rate and Rhythm: Normal rate and regular rhythm.     Pulses: Normal pulses.     Heart sounds: Normal heart sounds. No murmur heard. No gallop.   Pulmonary:     Effort: Pulmonary effort is normal.     Breath sounds: Normal breath sounds. No stridor. No wheezing, rhonchi or rales.  Abdominal:     General: Bowel sounds are normal.     Palpations: Abdomen is soft.     Tenderness: There is abdominal tenderness. There is no guarding.  Skin:    General: Skin is warm and dry.     Capillary Refill: Capillary refill takes less than 2 seconds.     Findings: No erythema or rash.  Neurological:     General: No focal deficit present.     Mental Status: She is alert and oriented to person, place, and time.  Psychiatric:        Mood and Affect: Mood normal.        Behavior: Behavior normal.        Thought Content: Thought content normal.        Judgment: Judgment normal.      UC Treatments / Results  Labs (all labs ordered are listed, but only abnormal results are displayed) Labs Reviewed - No data to display  EKG   Radiology No results found.  Procedures Procedures (including critical care  time)  Medications Ordered in UC Medications - No data to display  Initial Impression /  Assessment and Plan / UC Course  I have reviewed the triage vital signs and the nursing notes.  Pertinent labs & imaging results that were available during my care of the patient were reviewed by me and considered in my medical decision making (see chart for details).   Patient is a very pleasant 30 year old female here for evaluation of multiple complaints as listed in the HPI.  Patient's physical exam reveals heart sounds that are S1-S2 and crisp.  Lung sounds are clear to auscultation in all fields.  There is no stridor present when auscultating over the trachea bilaterally.  Patient is not tachypneic or hypoxic.  Patient's abdomen is soft, nondistended, with positive bowel sounds in all 4 quadrants.  Patient does have mild epigastric tenderness and right lower quadrant tenderness not over McBurney's point.  Patient has no guarding or rebound.  Given patient's cluster of symptoms and her physical exam suspect symptoms are result of a viral process.  Given the severity of her father's recent heart attack there could also be an anxiety component though patient denies this at present.  Patient left tearful but seemed reassured that her presentation was not dangerous or life-threatening.   Final Clinical Impressions(s) / UC Diagnoses   Final diagnoses:  Viral illness     Discharge Instructions     In for reevaluation if you develop any new or worsening symptoms.    ED Prescriptions    None     PDMP not reviewed this encounter.   Becky Augusta, NP 05/15/20 2001

## 2020-05-15 NOTE — ED Triage Notes (Signed)
Patient was seen here yesterday for cough, chest congestion. She states she hasn't had a fever since yesterday. She returns now for shortness of breath and states she feels like her airway is being pinched off.

## 2020-05-16 ENCOUNTER — Ambulatory Visit
Admission: RE | Admit: 2020-05-16 | Discharge: 2020-05-16 | Disposition: A | Discharge status: Home / Self Care | Payer: Self-pay | Source: Ambulatory Visit | Attending: Advanced Practice Midwife | Admitting: Advanced Practice Midwife

## 2020-05-16 ENCOUNTER — Other Ambulatory Visit: Payer: Self-pay | Admitting: Advanced Practice Midwife

## 2020-05-16 DIAGNOSIS — N946 Dysmenorrhea, unspecified: Secondary | ICD-10-CM

## 2020-05-16 DIAGNOSIS — N920 Excessive and frequent menstruation with regular cycle: Secondary | ICD-10-CM

## 2020-05-17 ENCOUNTER — Other Ambulatory Visit: Payer: Self-pay

## 2020-05-17 ENCOUNTER — Encounter: Payer: Self-pay | Admitting: Advanced Practice Midwife

## 2020-05-17 ENCOUNTER — Ambulatory Visit (INDEPENDENT_AMBULATORY_CARE_PROVIDER_SITE_OTHER): Payer: Self-pay | Admitting: Advanced Practice Midwife

## 2020-05-17 VITALS — BP 114/76 | HR 96 | Ht 70.0 in | Wt 289.0 lb

## 2020-05-17 DIAGNOSIS — Z09 Encounter for follow-up examination after completed treatment for conditions other than malignant neoplasm: Secondary | ICD-10-CM

## 2020-05-18 ENCOUNTER — Encounter: Payer: Self-pay | Admitting: Advanced Practice Midwife

## 2020-05-18 ENCOUNTER — Ambulatory Visit: Payer: Self-pay | Admitting: Advanced Practice Midwife

## 2020-05-18 ENCOUNTER — Ambulatory Visit
Admission: RE | Admit: 2020-05-18 | Discharge: 2020-05-18 | Disposition: A | Discharge status: Home / Self Care | Payer: Self-pay | Source: Ambulatory Visit | Attending: Advanced Practice Midwife | Admitting: Advanced Practice Midwife

## 2020-05-18 ENCOUNTER — Ambulatory Visit: Payer: Self-pay

## 2020-05-18 ENCOUNTER — Other Ambulatory Visit: Payer: Self-pay | Admitting: Advanced Practice Midwife

## 2020-05-18 DIAGNOSIS — N920 Excessive and frequent menstruation with regular cycle: Secondary | ICD-10-CM

## 2020-05-18 DIAGNOSIS — N946 Dysmenorrhea, unspecified: Secondary | ICD-10-CM

## 2020-05-18 NOTE — Progress Notes (Signed)
Patient ID: Marissa Savage, female   DOB: [birth year], 30 y.o.   MRN: [id number]  Reason for Consult: Follow-up (F/U TVUS - RM 3)    Subjective:  Date of Service: 05/17/2020  HPI:  Marissa Savage is a 30 y.o. female being seen for follow up visit after Gyn ultrasound that was done the previous day. The final report is not yet back. I asked Dr Tiburcio Pea to take a look at the images so I could give the patient a preliminary report. Per his review, no fibroids or significant enlargement of the endometrium. I will update the patient once the final report is in. She declines hormonal cycle regulation. We discussed other interventions including ablation and hysterectomy.   She mentions a different issue today: facial flushing (face gets bright red) that occurs usually once per day and is accompanied by heart racing. She was told by another provider that it it probably related to hormones. However, the patient describes the episodes as taking as much as an hour to resolve. There is no particular pattern or triggering factor that she is aware of. We discussed the difference between that and hot flashes (usually resolve within 30 seconds to 5 minutes), and other possible causes such as medications, alcohol, cardiac issues. I suggested she follow up with her PCP for further evaluation.   Past Medical History:  Diagnosis Date  . Asthma   . Enlarged lymph nodes   . Mold exposure 09/2018  . Vocal cord dysfunction    Family History  Problem Relation Age of Onset  . Breast cancer Maternal Aunt   . Leukemia Maternal Grandfather   . Stomach cancer Paternal Grandmother   . Pancreatic cancer Paternal Grandfather   . Healthy Mother   . Heart disease Father    Past Surgical History:  Procedure Laterality Date  . LIVER BIOPSY  2020  . WISDOM TOOTH EXTRACTION      Short Social History:  Social History   Tobacco Use  . Smoking status: Never Smoker  . Smokeless tobacco: Never Used  Substance Use Topics   . Alcohol use: Not Currently    Comment: occ    Allergies  Allergen Reactions  . Ciprofloxacin   . Shellfish Allergy     Tight chest, flushing in face  . Zyrtec [Cetirizine]     Current Outpatient Medications  Medication Sig Dispense Refill  . albuterol (VENTOLIN HFA) 108 (90 Base) MCG/ACT inhaler Inhale into the lungs.    . benzonatate (TESSALON) 100 MG capsule Take 2 capsules (200 mg total) by mouth 3 (three) times daily as needed for up to 7 days for cough. (Patient not taking: Reported on 05/17/2020) 21 capsule 0  . dicyclomine (BENTYL) 20 MG tablet Take by mouth.    . fluticasone (FLOVENT HFA) 110 MCG/ACT inhaler Inhale into the lungs.    Marland Kitchen ibuprofen (ADVIL) 800 MG tablet Take 1 tablet (800 mg total) by mouth every 8 (eight) hours as needed for up to 7 days for fever, headache or moderate pain. 21 tablet 0   No current facility-administered medications for this visit.    Review of Systems  Constitutional: Negative for chills and fever.  HENT: Negative for congestion, ear discharge, ear pain, hearing loss, sinus pain and sore throat.   Eyes: Negative for blurred vision and double vision.  Respiratory: Negative for cough, shortness of breath and wheezing.   Cardiovascular: Negative for chest pain, palpitations and leg swelling.       Positive for  heart racing  Gastrointestinal: Negative for abdominal pain, blood in stool, constipation, diarrhea, heartburn, melena, nausea and vomiting.  Genitourinary: Negative for dysuria, flank pain, frequency, hematuria and urgency.  Musculoskeletal: Negative for back pain, joint pain and myalgias.  Skin: Negative for itching and rash.  Neurological: Negative for dizziness, tingling, tremors, sensory change, speech change, focal weakness, seizures, loss of consciousness, weakness and headaches.  Endo/Heme/Allergies: Negative for environmental allergies. Does not bruise/bleed easily.       Positive for hot flushes  Psychiatric/Behavioral:  Negative for depression, hallucinations, memory loss, substance abuse and suicidal ideas. The patient is not nervous/anxious and does not have insomnia.         Objective:  Objective   Vitals:   05/17/20 1617  BP: 114/76  Pulse: 96  Weight: 289 lb (131.1 kg)  Height: 5\' 10"  (1.778 m)   Body mass index is 41.47 kg/m. Constitutional: Well nourished, well developed female in no acute distress.  HEENT: normal Skin: Warm and dry.  Respiratory:  Normal respiratory effort Neuro: DTRs 2+, Cranial nerves grossly intact Psych: Alert and Oriented x3. No memory deficits. Normal mood and affect.  MS: normal gait, normal bilateral lower extremity ROM/strength/stability.   Total time spent with patient the majority of which was consultation: 20 minutes  Assessment/Plan:     30 y.o. G0 P0000 female gyn follow up for menorrhagia  Ultrasound final report not available as of this writing: will update patient when report is available Follow up as needed with MD regarding possible interventions to treat menorrhagia Follow up with PCP regarding facial flushing     26 CNM [facility] 05/18/2020, 11:45 AM

## 2020-05-22 ENCOUNTER — Ambulatory Visit: Admit: 2020-05-22 | Discharge: 2020-05-23

## 2020-05-22 DIAGNOSIS — R002 Palpitations: Principal | ICD-10-CM

## 2020-05-22 DIAGNOSIS — R232 Flushing: Principal | ICD-10-CM

## 2020-05-22 DIAGNOSIS — M79601 Pain in right arm: Principal | ICD-10-CM

## 2020-05-23 NOTE — Progress Notes (Signed)
Update to visit note:   Final report of gyn ultrasound:  CLINICAL DATA:  Dysmenorrhea  EXAM: TRANSABDOMINAL AND TRANSVAGINAL ULTRASOUND OF PELVIS  TECHNIQUE: Both transabdominal and transvaginal ultrasound examinations of the pelvis were performed. Transabdominal technique was performed for global imaging of the pelvis including uterus, ovaries, adnexal regions, and pelvic cul-de-sac. It was necessary to proceed with endovaginal exam following the transabdominal exam to visualize the uterus, endometrium, ovaries and adnexa.  COMPARISON:  CT 01/19/2015  FINDINGS: Uterus  Measurements: 8.3 x 5.0 x 5.6 cm = volume: 122 mL. No fibroids or other mass visualized. Diffuse heterogeneous echotexture throughout the uterus.  Endometrium  Thickness: 7 mm in thickness.  No focal abnormality visualized.  Right ovary  Measurements: 2.4 x 1.9 x 2.0 cm = volume: 4.8 mL. Normal appearance/no adnexal mass.  Left ovary  Measurements: 2.7 x 2.1 x 2.6 cm = volume: 7.7 mL. Normal appearance/no adnexal mass.  Other findings  No abnormal free fluid.  IMPRESSION: No focal uterine abnormality. Diffuse heterogeneous echotexture can be seen with adenomyosis.  No acute findings.  Electronically Signed   By: Charlett Nose M.D.   On: 05/22/2020 09:32   Message sent to patient regarding images consistent with adenomyosis. Recommended treatments include: NSAIDS, hormone therapy, uterine artery embolization, hysterectomy. Follow up with MD as desired.   Parke Poisson, CNM [facility] 05/23/20, 11:48 AM

## 2020-05-25 DIAGNOSIS — R768 Other specified abnormal immunological findings in serum: Principal | ICD-10-CM

## 2020-06-13 ENCOUNTER — Ambulatory Visit: Admit: 2020-06-13

## 2020-06-28 ENCOUNTER — Ambulatory Visit: Admit: 2020-06-28 | Discharge: 2020-06-29

## 2020-07-20 ENCOUNTER — Ambulatory Visit: Admit: 2020-07-20

## 2020-09-26 ENCOUNTER — Ambulatory Visit: Admit: 2020-09-26 | Discharge: 2020-09-27

## 2020-09-26 DIAGNOSIS — R232 Flushing: Principal | ICD-10-CM

## 2020-09-26 DIAGNOSIS — E669 Obesity, unspecified: Principal | ICD-10-CM

## 2020-09-26 DIAGNOSIS — R768 Other specified abnormal immunological findings in serum: Principal | ICD-10-CM

## 2020-09-26 DIAGNOSIS — R002 Palpitations: Principal | ICD-10-CM

## 2020-10-10 ENCOUNTER — Ambulatory Visit
Admit: 2020-10-10 | Discharge: 2020-10-11 | Attending: Student in an Organized Health Care Education/Training Program | Primary: Student in an Organized Health Care Education/Training Program

## 2020-10-10 DIAGNOSIS — H6123 Impacted cerumen, bilateral: Principal | ICD-10-CM

## 2020-10-10 DIAGNOSIS — J329 Chronic sinusitis, unspecified: Principal | ICD-10-CM

## 2020-10-10 MED ORDER — FLUTICASONE PROPIONATE 50 MCG/ACTUATION NASAL SPRAY,SUSPENSION
Freq: Two times a day (BID) | NASAL | 0 refills | 0 days | Status: CP
Start: 2020-10-10 — End: 2021-10-10

## 2020-11-02 ENCOUNTER — Institutional Professional Consult (permissible substitution): Admit: 2020-11-02 | Discharge: 2020-11-03

## 2020-11-02 MED ORDER — BENZONATATE 100 MG CAPSULE
ORAL_CAPSULE | Freq: Four times a day (QID) | ORAL | 1 refills | 8 days | Status: CP | PRN
Start: 2020-11-02 — End: 2021-11-02

## 2020-11-07 DIAGNOSIS — J40 Bronchitis, not specified as acute or chronic: Principal | ICD-10-CM

## 2020-11-07 MED ORDER — AMOXICILLIN 875 MG TABLET
ORAL_TABLET | Freq: Two times a day (BID) | ORAL | 0 refills | 7.00000 days | Status: CP
Start: 2020-11-07 — End: ?

## 2020-11-08 ENCOUNTER — Ambulatory Visit: Admit: 2020-11-08 | Discharge: 2020-11-09

## 2020-11-08 DIAGNOSIS — R232 Flushing: Principal | ICD-10-CM

## 2020-11-08 DIAGNOSIS — R768 Other specified abnormal immunological findings in serum: Principal | ICD-10-CM

## 2020-11-29 ENCOUNTER — Ambulatory Visit
Admit: 2020-11-29 | Discharge: 2020-11-30 | Attending: Student in an Organized Health Care Education/Training Program | Primary: Student in an Organized Health Care Education/Training Program

## 2020-11-29 DIAGNOSIS — R232 Flushing: Principal | ICD-10-CM

## 2020-11-29 DIAGNOSIS — R002 Palpitations: Principal | ICD-10-CM

## 2020-11-30 ENCOUNTER — Ambulatory Visit: Admit: 2020-11-30 | Discharge: 2020-12-01

## 2020-12-13 DIAGNOSIS — R232 Flushing: Principal | ICD-10-CM

## 2020-12-13 DIAGNOSIS — E669 Obesity, unspecified: Principal | ICD-10-CM

## 2020-12-13 DIAGNOSIS — R002 Palpitations: Principal | ICD-10-CM

## 2020-12-13 DIAGNOSIS — R6889 Other general symptoms and signs: Principal | ICD-10-CM

## 2021-01-19 ENCOUNTER — Ambulatory Visit
Admit: 2021-01-19 | Discharge: 2021-01-20 | Payer: PRIVATE HEALTH INSURANCE | Attending: Student in an Organized Health Care Education/Training Program | Primary: Student in an Organized Health Care Education/Training Program

## 2021-01-19 DIAGNOSIS — H6123 Impacted cerumen, bilateral: Principal | ICD-10-CM

## 2021-03-18 ENCOUNTER — Ambulatory Visit: Admit: 2021-03-18 | Discharge: 2021-03-18 | Payer: PRIVATE HEALTH INSURANCE

## 2021-03-27 DIAGNOSIS — G4739 Other sleep apnea: Principal | ICD-10-CM

## 2021-04-27 ENCOUNTER — Ambulatory Visit
Admit: 2021-04-27 | Payer: PRIVATE HEALTH INSURANCE | Attending: Student in an Organized Health Care Education/Training Program | Primary: Student in an Organized Health Care Education/Training Program

## 2021-07-03 ENCOUNTER — Ambulatory Visit: Admit: 2021-07-03 | Discharge: 2021-07-04 | Payer: PRIVATE HEALTH INSURANCE

## 2021-07-13 DIAGNOSIS — N760 Acute vaginitis: Principal | ICD-10-CM

## 2021-07-13 MED ORDER — FLUCONAZOLE 150 MG TABLET
ORAL_TABLET | Freq: Once | ORAL | 0 refills | 2 days | Status: CP
Start: 2021-07-13 — End: 2021-07-13

## 2021-08-01 ENCOUNTER — Ambulatory Visit: Admit: 2021-08-01 | Discharge: 2021-08-01 | Payer: PRIVATE HEALTH INSURANCE

## 2021-08-03 DIAGNOSIS — G4733 Obstructive sleep apnea (adult) (pediatric): Principal | ICD-10-CM

## 2021-08-08 IMAGING — US US PELVIS COMPLETE WITH TRANSVAGINAL
2 series · 13 of 25 positions shown · non-contrast
Comparison: CT 01/19/2015

CLINICAL DATA: Dysmenorrhea



[Series 1: us pelvis complete with transvaginal · 0.22mm/px · 12 of 110 slices shown (1 of 2)]
[im 1/110]
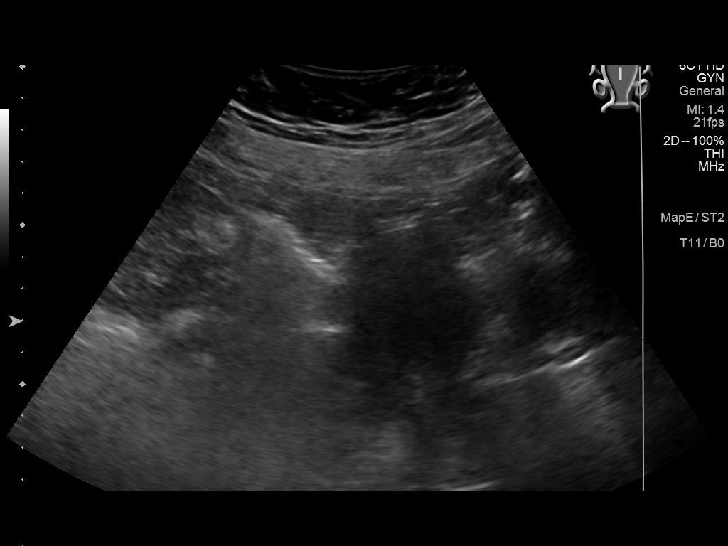
[im 10/110]
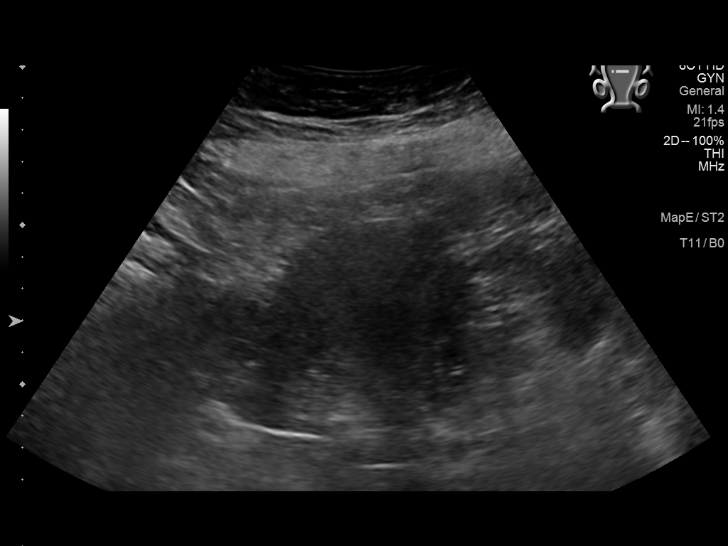
[im 19/110]
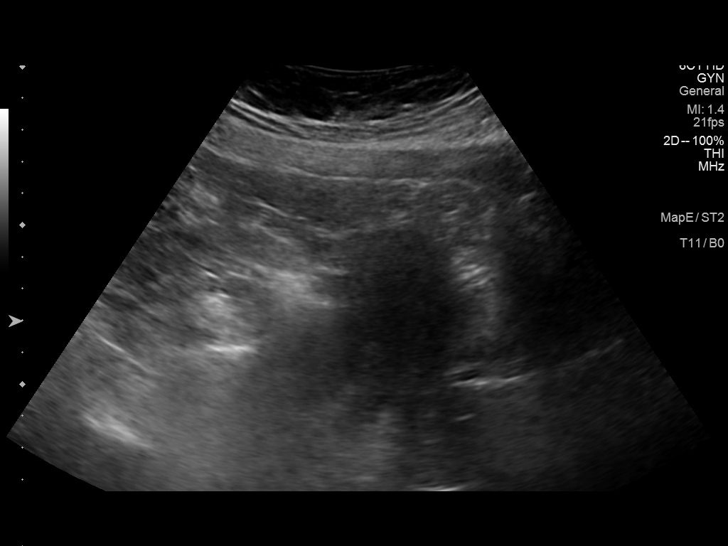
[im 29/110]
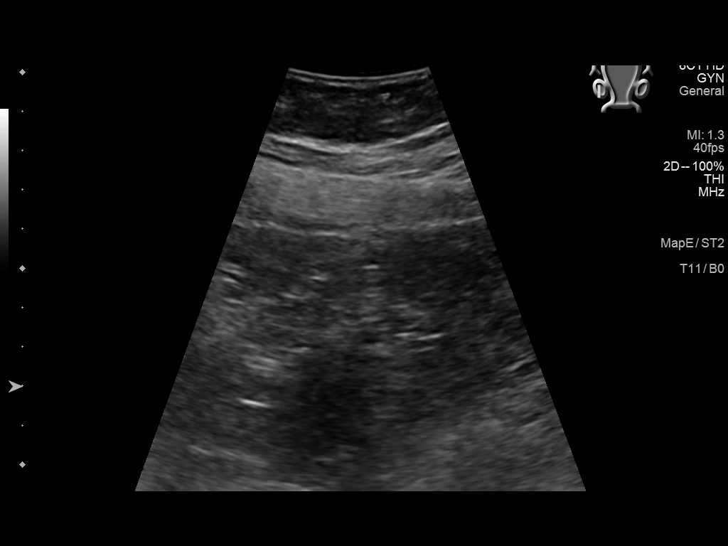
[im 38/110]
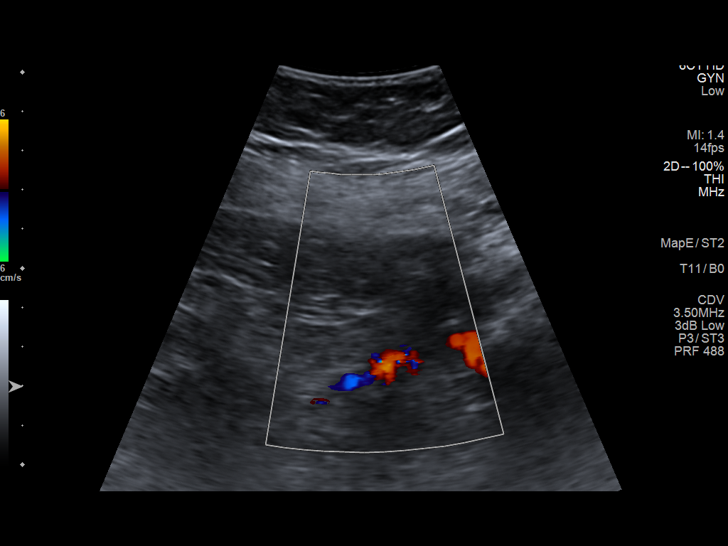
[im 48/110]
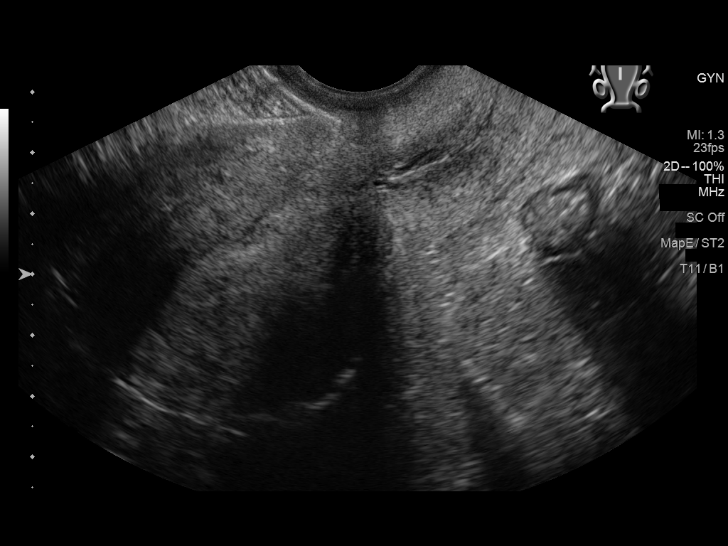
[im 57/110]
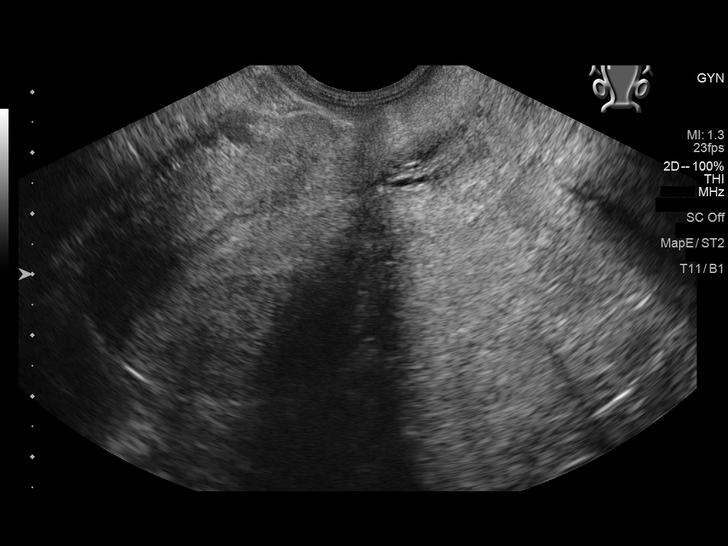
[im 67/110]
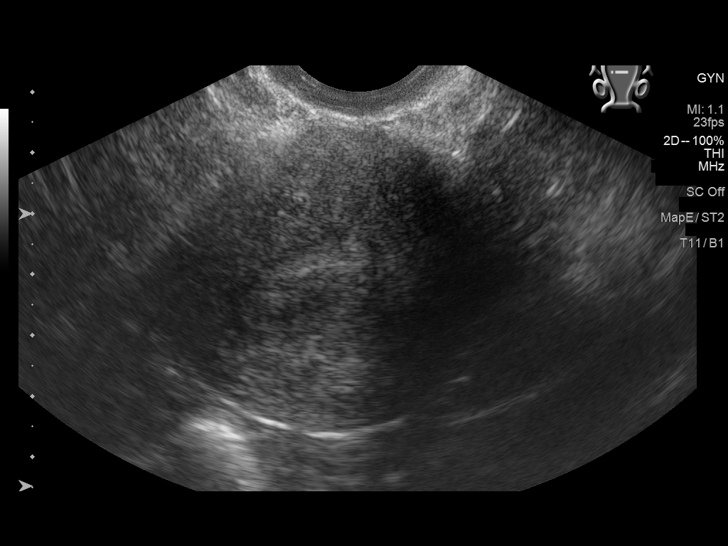
[im 76/110]
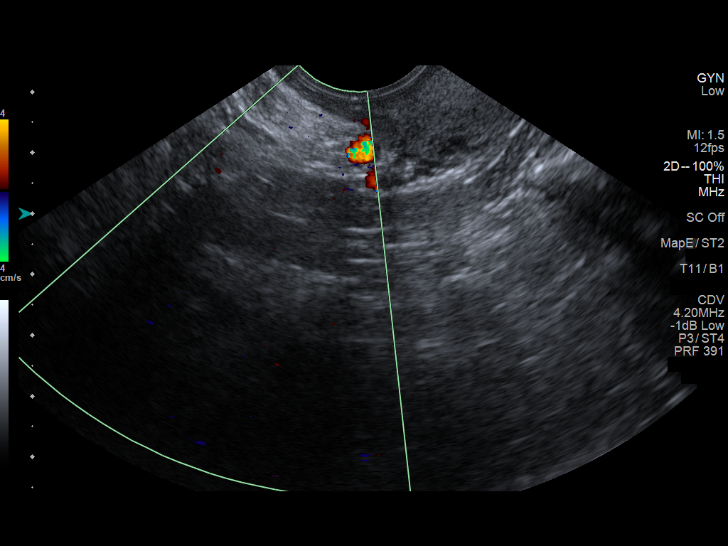
[im 86/110]
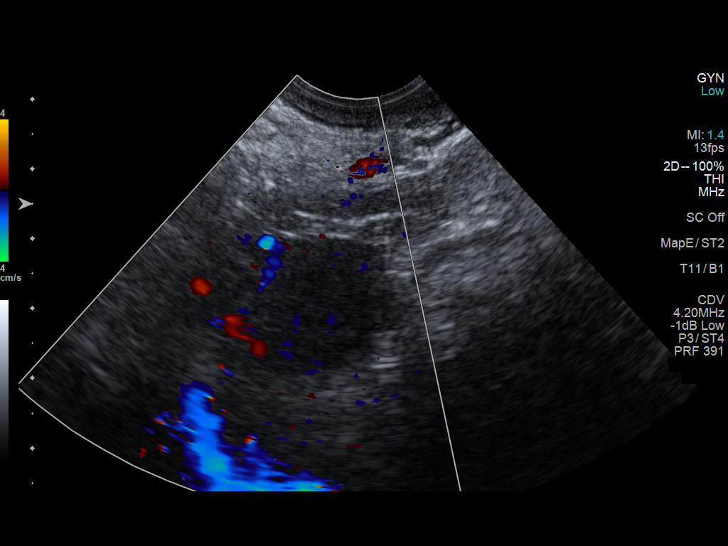
[im 95/110]
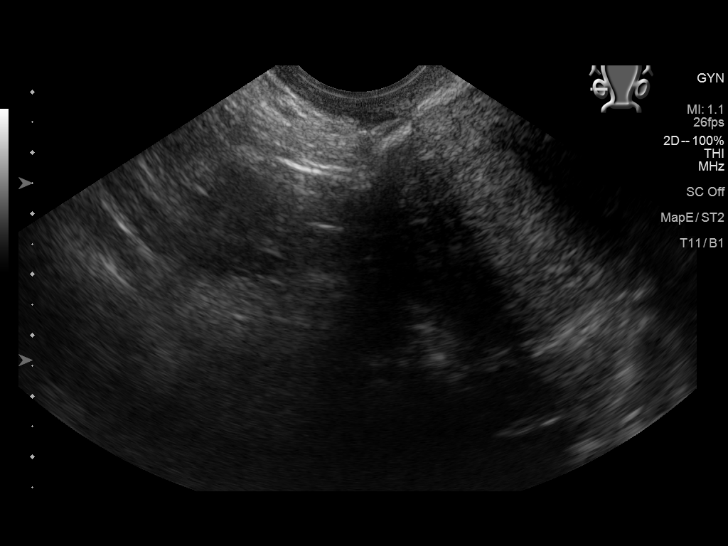
[im 105/110]
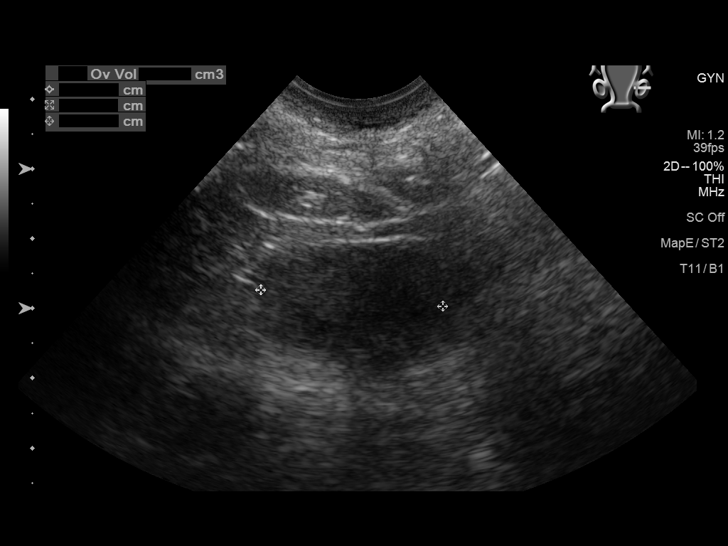

[Series 1001: us pelvis complete with transvaginal · 0.12mm/px · 1 of 1 slices shown (2 of 2)]
[im 1/1]
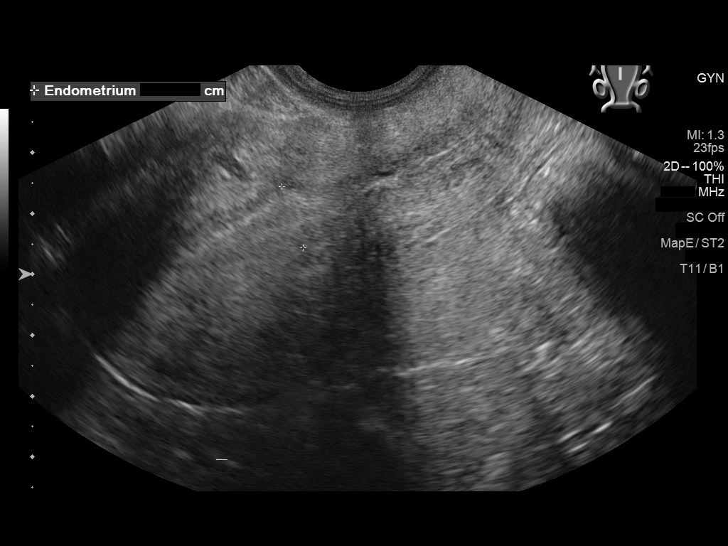

[13 of 25 positions shown; findings below may reference images not displayed]

FINDINGS: Uterus

Measurements: 8.3 x 5.0 x 5.6 cm = volume: 122 mL. No fibroids or
other mass visualized. Diffuse heterogeneous echotexture throughout
the uterus.

Endometrium

Thickness: 7 mm in thickness.  No focal abnormality visualized.

Right ovary

Measurements: 2.4 x 1.9 x 2.0 cm = volume: 4.8 mL. Normal
appearance/no adnexal mass.

Left ovary

Measurements: 2.7 x 2.1 x 2.6 cm = volume: 7.7 mL. Normal
appearance/no adnexal mass.

Other findings

No abnormal free fluid.
IMPRESSION: No focal uterine abnormality. Diffuse heterogeneous echotexture can
be seen with adenomyosis.

No acute findings.

## 2021-08-22 DIAGNOSIS — Z77120 Contact with and (suspected) exposure to mold (toxic): Principal | ICD-10-CM

## 2021-08-22 DIAGNOSIS — J4520 Mild intermittent asthma, uncomplicated: Principal | ICD-10-CM

## 2021-08-28 DIAGNOSIS — J4520 Mild intermittent asthma, uncomplicated: Principal | ICD-10-CM

## 2021-08-28 DIAGNOSIS — Z77120 Contact with and (suspected) exposure to mold (toxic): Principal | ICD-10-CM

## 2021-08-29 ENCOUNTER — Ambulatory Visit: Admit: 2021-08-29 | Discharge: 2021-08-30 | Payer: PRIVATE HEALTH INSURANCE

## 2021-10-12 DIAGNOSIS — K257 Chronic gastric ulcer without hemorrhage or perforation: Principal | ICD-10-CM

## 2021-10-12 DIAGNOSIS — K760 Fatty (change of) liver, not elsewhere classified: Principal | ICD-10-CM

## 2021-10-17 ENCOUNTER — Ambulatory Visit: Admit: 2021-10-17 | Discharge: 2021-10-18 | Payer: PRIVATE HEALTH INSURANCE

## 2021-10-17 DIAGNOSIS — Z01419 Encounter for gynecological examination (general) (routine) without abnormal findings: Principal | ICD-10-CM

## 2021-10-17 DIAGNOSIS — B9689 Other specified bacterial agents as the cause of diseases classified elsewhere: Principal | ICD-10-CM

## 2021-10-17 DIAGNOSIS — Z124 Encounter for screening for malignant neoplasm of cervix: Principal | ICD-10-CM

## 2021-10-17 DIAGNOSIS — N760 Acute vaginitis: Principal | ICD-10-CM

## 2021-10-17 DIAGNOSIS — G4733 Obstructive sleep apnea (adult) (pediatric): Principal | ICD-10-CM

## 2021-10-17 DIAGNOSIS — J4520 Mild intermittent asthma, uncomplicated: Principal | ICD-10-CM

## 2021-10-17 DIAGNOSIS — E669 Obesity, unspecified: Principal | ICD-10-CM

## 2021-10-17 MED ORDER — METRONIDAZOLE 0.75 % (37.5 MG/5 GRAM) VAGINAL GEL
Freq: Every day | VAGINAL | 0 refills | 0 days | Status: CP
Start: 2021-10-17 — End: ?

## 2021-11-14 ENCOUNTER — Telehealth
Admit: 2021-11-14 | Discharge: 2021-11-15 | Payer: PRIVATE HEALTH INSURANCE | Attending: Registered" | Primary: Registered"

## 2021-11-14 DIAGNOSIS — E669 Obesity, unspecified: Principal | ICD-10-CM

## 2021-11-16 DIAGNOSIS — N760 Acute vaginitis: Principal | ICD-10-CM

## 2021-11-16 DIAGNOSIS — B9689 Other specified bacterial agents as the cause of diseases classified elsewhere: Principal | ICD-10-CM

## 2021-11-16 MED ORDER — METRONIDAZOLE 0.75 % (37.5 MG/5 GRAM) VAGINAL GEL
Freq: Every day | VAGINAL | 0 refills | 0 days | Status: CP
Start: 2021-11-16 — End: ?

## 2021-11-30 ENCOUNTER — Ambulatory Visit
Admit: 2021-11-30 | Discharge: 2021-12-01 | Payer: PRIVATE HEALTH INSURANCE | Attending: Student in an Organized Health Care Education/Training Program | Primary: Student in an Organized Health Care Education/Training Program

## 2021-11-30 DIAGNOSIS — H6123 Impacted cerumen, bilateral: Principal | ICD-10-CM

## 2021-12-05 ENCOUNTER — Telehealth
Admit: 2021-12-05 | Discharge: 2021-12-06 | Payer: PRIVATE HEALTH INSURANCE | Attending: Registered" | Primary: Registered"

## 2021-12-05 DIAGNOSIS — E669 Obesity, unspecified: Principal | ICD-10-CM

## 2022-01-06 ENCOUNTER — Emergency Department
Admit: 2022-01-06 | Discharge: 2022-01-07 | Disposition: A | Discharge status: Home / Self Care | Payer: PRIVATE HEALTH INSURANCE

## 2022-01-06 ENCOUNTER — Ambulatory Visit
Admit: 2022-01-06 | Discharge: 2022-01-07 | Disposition: A | Discharge status: Home / Self Care | Payer: PRIVATE HEALTH INSURANCE

## 2022-01-06 DIAGNOSIS — S62609B Fracture of unspecified phalanx of unspecified finger, initial encounter for open fracture: Principal | ICD-10-CM

## 2022-01-06 DIAGNOSIS — S61209A Unspecified open wound of unspecified finger without damage to nail, initial encounter: Principal | ICD-10-CM

## 2022-01-06 DIAGNOSIS — S61319A Laceration without foreign body of unspecified finger with damage to nail, initial encounter: Principal | ICD-10-CM

## 2022-01-06 MED ORDER — CEPHALEXIN 500 MG CAPSULE
ORAL_CAPSULE | Freq: Three times a day (TID) | ORAL | 0 refills | 10 days | Status: CP
Start: 2022-01-06 — End: 2022-01-16

## 2022-01-06 MED ORDER — ACETAMINOPHEN ER 650 MG TABLET,EXTENDED RELEASE
ORAL_TABLET | Freq: Three times a day (TID) | ORAL | 0 refills | 10 days | Status: CP | PRN
Start: 2022-01-06 — End: ?

## 2022-01-06 MED ORDER — IBUPROFEN 800 MG TABLET
ORAL_TABLET | Freq: Three times a day (TID) | ORAL | 0 refills | 10 days | Status: CP | PRN
Start: 2022-01-06 — End: ?

## 2022-01-08 DIAGNOSIS — N760 Acute vaginitis: Principal | ICD-10-CM

## 2022-01-08 MED ORDER — FLUCONAZOLE 150 MG TABLET
ORAL_TABLET | Freq: Once | ORAL | 0 refills | 2 days | Status: CP
Start: 2022-01-08 — End: 2022-01-08

## 2022-01-17 ENCOUNTER — Ambulatory Visit: Admit: 2022-01-17 | Discharge: 2022-01-18 | Payer: PRIVATE HEALTH INSURANCE

## 2022-01-17 MED ORDER — NAPROXEN 500 MG TABLET
ORAL_TABLET | Freq: Two times a day (BID) | ORAL | 0 refills | 30 days | Status: CP
Start: 2022-01-17 — End: 2022-02-16

## 2022-01-22 ENCOUNTER — Ambulatory Visit: Admit: 2022-01-22 | Discharge: 2022-01-23 | Payer: PRIVATE HEALTH INSURANCE

## 2022-02-05 ENCOUNTER — Ambulatory Visit: Admit: 2022-02-05 | Discharge: 2022-02-06 | Payer: PRIVATE HEALTH INSURANCE

## 2022-02-08 ENCOUNTER — Ambulatory Visit
Admit: 2022-02-08 | Discharge: 2022-02-09 | Payer: PRIVATE HEALTH INSURANCE | Attending: Student in an Organized Health Care Education/Training Program | Primary: Student in an Organized Health Care Education/Training Program

## 2022-02-08 DIAGNOSIS — H6123 Impacted cerumen, bilateral: Principal | ICD-10-CM

## 2022-02-21 ENCOUNTER — Ambulatory Visit: Admit: 2022-02-21 | Discharge: 2022-02-22 | Payer: PRIVATE HEALTH INSURANCE

## 2022-02-21 DIAGNOSIS — K760 Fatty (change of) liver, not elsewhere classified: Principal | ICD-10-CM

## 2022-02-21 DIAGNOSIS — K219 Gastro-esophageal reflux disease without esophagitis: Principal | ICD-10-CM

## 2022-02-21 DIAGNOSIS — K257 Chronic gastric ulcer without hemorrhage or perforation: Principal | ICD-10-CM

## 2022-02-21 MED ORDER — METOCLOPRAMIDE 5 MG TABLET
ORAL_TABLET | Freq: Four times a day (QID) | ORAL | 1 refills | 30 days | Status: CP
Start: 2022-02-21 — End: ?

## 2022-02-21 MED ORDER — PANTOPRAZOLE 40 MG TABLET,DELAYED RELEASE
ORAL_TABLET | Freq: Two times a day (BID) | ORAL | 11 refills | 30 days | Status: CP
Start: 2022-02-21 — End: ?

## 2022-02-28 ENCOUNTER — Ambulatory Visit
Admit: 2022-02-28 | Payer: PRIVATE HEALTH INSURANCE | Attending: Rehabilitative and Restorative Service Providers" | Primary: Rehabilitative and Restorative Service Providers"

## 2022-02-28 ENCOUNTER — Ambulatory Visit
Admit: 2022-02-28 | Discharge: 2022-03-29 | Payer: PRIVATE HEALTH INSURANCE | Attending: Rehabilitative and Restorative Service Providers" | Primary: Rehabilitative and Restorative Service Providers"

## 2022-03-08 ENCOUNTER — Ambulatory Visit: Admit: 2022-03-08 | Payer: Self-pay

## 2022-03-08 ENCOUNTER — Ambulatory Visit: Admit: 2022-03-08 | Discharge: 2022-03-09 | Payer: PRIVATE HEALTH INSURANCE

## 2022-03-08 DIAGNOSIS — J029 Acute pharyngitis, unspecified: Principal | ICD-10-CM

## 2022-03-08 DIAGNOSIS — R051 Acute cough: Principal | ICD-10-CM

## 2022-03-08 MED ORDER — PREDNISONE 20 MG TABLET
ORAL_TABLET | Freq: Every day | ORAL | 0 refills | 3 days | Status: CP
Start: 2022-03-08 — End: 2022-03-11

## 2022-03-08 MED ORDER — BENZONATATE 200 MG CAPSULE
ORAL_CAPSULE | Freq: Three times a day (TID) | ORAL | 0 refills | 7 days | Status: CP | PRN
Start: 2022-03-08 — End: 2022-03-15

## 2022-03-15 DIAGNOSIS — J4520 Mild intermittent asthma, uncomplicated: Principal | ICD-10-CM

## 2022-03-15 MED ORDER — FLUTICASONE PROPIONATE 110 MCG/ACTUATION HFA AEROSOL INHALER
Freq: Two times a day (BID) | RESPIRATORY_TRACT | 0 refills | 0 days | Status: CP
Start: 2022-03-15 — End: 2023-03-15

## 2022-03-20 ENCOUNTER — Ambulatory Visit: Admit: 2022-03-20 | Discharge: 2022-03-21 | Payer: PRIVATE HEALTH INSURANCE

## 2022-03-22 DIAGNOSIS — K580 Irritable bowel syndrome with diarrhea: Principal | ICD-10-CM

## 2022-03-22 MED ORDER — RIFAXIMIN 550 MG TABLET
ORAL_TABLET | Freq: Three times a day (TID) | ORAL | 0 refills | 14 days | Status: CP
Start: 2022-03-22 — End: ?
  Filled 2022-03-29: qty 42, 14d supply, fill #0

## 2022-03-26 DIAGNOSIS — K580 Irritable bowel syndrome with diarrhea: Principal | ICD-10-CM

## 2022-03-29 NOTE — Unmapped (Signed)
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  I am speaking to the patient.    Was a Nurse, learning disability used for this call? No    Verified patient's date of birth / HIPAA.    Specialty Lite medication(s) to be sent: Infectious Disease: Xifaxan      Non-specialty medications/supplies to be sent: n/a      Medications not needed at this time: n/a         An offer to provide counseling to the patient regarding their medication was made. The patient accepted counseling. The patient was counseled on the following only: medication administration, missed dose instructions, goals of therapy, side effects and monitoring parameters, warnings and precautions, drug/food interactions, and storage, handling precautions, and disposal      Current Medications (including OTC/herbals), Comorbidities and Allergies     Current Outpatient Medications   Medication Sig Dispense Refill    acetaminophen (TYLENOL 8 HOUR) 650 MG CR tablet Take 1 tablet (650 mg total) by mouth every eight (8) hours as needed for pain. 30 tablet 0    fluticasone propionate (FLOVENT HFA) 110 mcg/actuation inhaler Inhale 2 puffs two (2) times a day. 12 g 0    loratadine (CLARITIN) 10 mg tablet       metoclopramide (REGLAN) 5 MG tablet Take 1 tablet (5 mg total) by mouth four (4) times a day. 120 tablet 1    pantoprazole (PROTONIX) 40 MG tablet Take 1 tablet (40 mg total) by mouth two (2) times a day. 60 tablet 11    XIFAXAN 550 mg Tab Take 1 tablet (550 mg total) by mouth Three (3) times a day. 42 tablet 0     No current facility-administered medications for this visit.       Allergies   Allergen Reactions    Ciprofloxacin      Gets nightmares, disoriented, confusion  Other reaction(s): Other (See Comments)  nightmares      Cetirizine-Pseudoephedrine      Other reaction(s): Other (See Comments)  Insomnia    Shellfish Containing Products      Tight chest, flushing in face    Zyrtec [Cetirizine] Dizziness     Dizziness and insomnia       Patient Active Problem List   Diagnosis    Chronic stomach ulcer    Elevated LFTs    Forgetfulness    Hepatic steatosis    Mild intermittent asthma without complication    Mold exposure    Dysphonia    Facial flushing    Palpitations    Positive ANA (antinuclear antibody)    Obesity    Obstructive sleep apnea syndrome    Bilateral impacted cerumen    Acute cough       Reviewed and up to date in Epic.    Appropriateness of Therapy     Prescription has been clinically reviewed: Yes    Financial Information     Medication Assistance provided: Prior Authorization    Anticipated copay of $0.00 reviewed with patient.     Patient Specific Needs     Does the patient have any physical, cognitive, or cultural barriers? No    Does the patient have adequate living arrangements? (i.e. the ability to store and take their medication appropriately) Yes    Did you identify any home environmental safety or security hazards? No    Patient  prefers to have medications discussed with  Patient     Is the patient or caregiver able to read and understand education materials at a high school level or above? Yes    Patient's primary language is  English     Is the patient high risk? No    SOCIAL DETERMINANTS OF HEALTH     At the [facility], we have learned that life circumstances - like trouble affording food, housing, utilities, or transportation can affect the health of many of our patients.   That is why we wanted to ask: are you currently experiencing any life circumstances that are negatively impacting your health and/or quality of life? Patient declined to answer    Social Determinants of Health     Financial Resource Strain: Low Risk  (10/17/2021)    Overall Financial Resource Strain (CARDIA)     Difficulty of Paying Living Expenses: Not hard at all   Internet Connectivity: Not on file   Food Insecurity: No Food Insecurity (10/17/2021)    Hunger Vital Sign     Worried About Running Out of Food in the Last Year: Never true     Ran Out of Food in the Last Year: Never true   Tobacco Use: Low Risk  (03/08/2022)    Patient History     Smoking Tobacco Use: Never     Smokeless Tobacco Use: Never     Passive Exposure: Not on file   Housing/Utilities: Low Risk  (10/17/2021)    Housing/Utilities     Within the past 12 months, have you ever stayed: outside, in a car, in a tent, in an overnight shelter, or temporarily in someone else's home (i.e. couch-surfing)?: No     Are you worried about losing your housing?: No     Within the past 12 months, have you been unable to get utilities (heat, electricity) when it was really needed?: No   Alcohol Use: Not At Risk (02/18/2020)    Alcohol Use     How often do you have a drink containing alcohol?: Monthly or less     How many drinks containing alcohol do you have on a typical day when you are drinking?: 1 - 2     How often do you have 5 or more drinks on one occasion?: Never   Transportation Needs: No Transportation Needs (10/17/2021)    PRAPARE - Therapist, art (Medical): No     Lack of Transportation (Non-Medical): No   Substance Use: Not on file   Health Literacy: Not on file   Physical Activity: Not on file   Interpersonal Safety: Not on file   Stress: Not on file   Intimate Partner Violence: Not At Risk (02/21/2022)    Humiliation, Afraid, Rape, and Kick questionnaire     Fear of Current or Ex-Partner: No     Emotionally Abused: No     Physically Abused: No     Sexually Abused: No   Depression: Not at risk (10/17/2021)    PHQ-2     PHQ-2 Score: 0   Social Connections: Not on file       Would you be willing to receive help with any of the needs that you have identified today? Not applicable    Delivery Information     Verified delivery address.    Scheduled delivery date: 03/29/22    Expected start date: 03/29/22    Medication will be delivered via Same Day Courier  to the prescription address in [facility]. This shipment will not require a signature.      Explained the services we provide at [facility] and that each month we will send text messages and/or mychart messages to set up refills. Informed patient that refills should be scheduled 7-10 days prior to when they will run out of medication. Informed patient that a welcome packet, containing information about our pharmacy and other support services, a Notice of Privacy Practices, and a drug information handout will be sent.      The patient or caregiver noted above participated in the development of this care plan and knows that they can request review of or adjustments to the care plan at any time.      Patient or caregiver verbalized understanding of the above information as well as how to contact the pharmacy at [phone] option 4 with any questions/concerns.  The pharmacy is open Monday through Friday 8:30am-4:30pm.  A pharmacist is available 24/7 via pager to answer any clinical questions they may have.      Roderic Palau, PharmD  [facility] Specialty Pharmacist

## 2022-03-29 NOTE — Unmapped (Signed)
Specialty Medication(s): Xifaxan 550mg     Ms.Ramaswamy has been dis-enrolled from the [facility] specialty pharmacy services due to her upcomming therapy completion - expected therapy completion date: 04/12/22.      Additional information provided to the patient: We can re-enroll you in the future if needed.    Roderic Palau, PharmD  [facility] Specialty Pharmacist

## 2022-03-29 NOTE — Unmapped (Signed)
[facility] Specialty Medication Onboarding    Specialty Medication: XIFAXAN 550 mg Tab (rifAXIMin)  Prior Authorization: Approved   Financial Assistance: No - copay  <$25  Final Copay/Day Supply: $0 / 14    Insurance Restrictions: None     Notes to Pharmacist:     The triage team has completed the benefits investigation and has determined that the patient is able to fill this medication at [facility]. Please contact the patient to complete the onboarding or follow up with the prescribing physician as needed.

## 2022-04-03 DIAGNOSIS — K219 Gastro-esophageal reflux disease without esophagitis: Principal | ICD-10-CM

## 2022-04-03 MED ORDER — METOCLOPRAMIDE 5 MG TABLET
ORAL_TABLET | Freq: Four times a day (QID) | ORAL | 1 refills | 30 days | Status: CP
Start: 2022-04-03 — End: 2022-04-03

## 2022-04-16 DIAGNOSIS — R110 Nausea: Principal | ICD-10-CM

## 2022-04-16 DIAGNOSIS — R140 Abdominal distension (gaseous): Principal | ICD-10-CM

## 2022-04-16 DIAGNOSIS — R1013 Epigastric pain: Principal | ICD-10-CM

## 2022-05-08 ENCOUNTER — Encounter: Admit: 2022-05-08 | Discharge: 2022-05-08 | Payer: PRIVATE HEALTH INSURANCE

## 2022-05-08 ENCOUNTER — Ambulatory Visit: Admit: 2022-05-08 | Discharge: 2022-05-08 | Payer: PRIVATE HEALTH INSURANCE

## 2022-05-09 ENCOUNTER — Ambulatory Visit: Admit: 2022-05-09 | Discharge: 2022-05-10 | Payer: PRIVATE HEALTH INSURANCE

## 2022-05-09 DIAGNOSIS — R232 Flushing: Principal | ICD-10-CM

## 2022-05-10 DIAGNOSIS — R232 Flushing: Principal | ICD-10-CM

## 2022-05-14 ENCOUNTER — Ambulatory Visit
Admit: 2022-05-14 | Discharge: 2022-05-15 | Payer: PRIVATE HEALTH INSURANCE | Attending: Physician Assistant | Primary: Physician Assistant

## 2022-05-14 DIAGNOSIS — R140 Abdominal distension (gaseous): Principal | ICD-10-CM

## 2022-05-14 DIAGNOSIS — R110 Nausea: Principal | ICD-10-CM

## 2022-05-14 DIAGNOSIS — R1013 Epigastric pain: Principal | ICD-10-CM

## 2022-05-14 DIAGNOSIS — K2970 Gastritis, unspecified, without bleeding: Principal | ICD-10-CM

## 2022-05-14 MED ORDER — FAMOTIDINE 20 MG TABLET
ORAL_TABLET | Freq: Two times a day (BID) | ORAL | 3 refills | 90 days | Status: CP
Start: 2022-05-14 — End: 2023-05-14

## 2022-05-14 MED ORDER — PANTOPRAZOLE 40 MG TABLET,DELAYED RELEASE
ORAL_TABLET | Freq: Every day | ORAL | 3 refills | 90 days | Status: CP
Start: 2022-05-14 — End: 2023-05-14

## 2022-05-15 ENCOUNTER — Ambulatory Visit: Admit: 2022-05-15 | Discharge: 2022-05-16 | Payer: PRIVATE HEALTH INSURANCE

## 2022-05-15 DIAGNOSIS — E669 Obesity, unspecified: Principal | ICD-10-CM

## 2022-05-15 DIAGNOSIS — N898 Other specified noninflammatory disorders of vagina: Principal | ICD-10-CM

## 2022-05-15 DIAGNOSIS — R232 Flushing: Principal | ICD-10-CM

## 2022-05-15 DIAGNOSIS — R7989 Other specified abnormal findings of blood chemistry: Principal | ICD-10-CM

## 2022-05-16 MED ORDER — NAPROXEN 500 MG TABLET
ORAL_TABLET | Freq: Two times a day (BID) | ORAL | 0 refills | 0 days
Start: 2022-05-16 — End: ?

## 2022-05-17 ENCOUNTER — Ambulatory Visit
Admit: 2022-05-17 | Discharge: 2022-05-18 | Payer: PRIVATE HEALTH INSURANCE | Attending: Student in an Organized Health Care Education/Training Program | Primary: Student in an Organized Health Care Education/Training Program

## 2022-05-17 DIAGNOSIS — H6123 Impacted cerumen, bilateral: Principal | ICD-10-CM

## 2022-05-24 ENCOUNTER — Ambulatory Visit: Admit: 2022-05-24 | Discharge: 2022-05-25 | Payer: PRIVATE HEALTH INSURANCE

## 2022-05-24 DIAGNOSIS — J329 Chronic sinusitis, unspecified: Principal | ICD-10-CM

## 2022-05-24 MED ORDER — AMOXICILLIN 875 MG-POTASSIUM CLAVULANATE 125 MG TABLET
ORAL_TABLET | Freq: Two times a day (BID) | ORAL | 0 refills | 10 days | Status: CP
Start: 2022-05-24 — End: 2022-06-03

## 2022-06-21 DIAGNOSIS — K219 Gastro-esophageal reflux disease without esophagitis: Principal | ICD-10-CM

## 2022-06-21 MED ORDER — METOCLOPRAMIDE 5 MG TABLET
ORAL_TABLET | Freq: Four times a day (QID) | ORAL | 1 refills | 30 days | Status: CP
Start: 2022-06-21 — End: ?

## 2022-07-26 ENCOUNTER — Ambulatory Visit
Admit: 2022-07-26 | Discharge: 2022-07-27 | Payer: PRIVATE HEALTH INSURANCE | Attending: Student in an Organized Health Care Education/Training Program | Primary: Student in an Organized Health Care Education/Training Program

## 2022-07-26 DIAGNOSIS — H6123 Impacted cerumen, bilateral: Principal | ICD-10-CM

## 2022-08-08 ENCOUNTER — Ambulatory Visit: Admit: 2022-08-08 | Discharge: 2022-08-09 | Payer: PRIVATE HEALTH INSURANCE

## 2022-08-08 DIAGNOSIS — Z113 Encounter for screening for infections with a predominantly sexual mode of transmission: Principal | ICD-10-CM

## 2022-08-08 DIAGNOSIS — K2970 Gastritis, unspecified, without bleeding: Principal | ICD-10-CM

## 2022-08-08 DIAGNOSIS — R1013 Epigastric pain: Principal | ICD-10-CM

## 2022-08-08 MED ORDER — PANTOPRAZOLE 40 MG TABLET,DELAYED RELEASE
ORAL_TABLET | Freq: Every day | ORAL | 0 refills | 90 days | Status: CP
Start: 2022-08-08 — End: 2023-08-08

## 2022-08-12 DIAGNOSIS — F439 Reaction to severe stress, unspecified: Principal | ICD-10-CM

## 2022-08-29 ENCOUNTER — Ambulatory Visit: Admit: 2022-08-29 | Payer: PRIVATE HEALTH INSURANCE

## 2022-09-09 ENCOUNTER — Telehealth
Admit: 2022-09-09 | Discharge: 2022-09-10 | Payer: PRIVATE HEALTH INSURANCE | Attending: Social Worker | Primary: Social Worker

## 2022-09-09 DIAGNOSIS — F411 Generalized anxiety disorder: Principal | ICD-10-CM

## 2022-09-09 DIAGNOSIS — F439 Reaction to severe stress, unspecified: Principal | ICD-10-CM

## 2022-09-19 ENCOUNTER — Ambulatory Visit: Admit: 2022-09-19 | Discharge: 2022-09-20 | Payer: PRIVATE HEALTH INSURANCE

## 2022-09-19 DIAGNOSIS — R399 Unspecified symptoms and signs involving the genitourinary system: Principal | ICD-10-CM

## 2022-09-19 DIAGNOSIS — G4733 Obstructive sleep apnea (adult) (pediatric): Principal | ICD-10-CM

## 2022-09-19 DIAGNOSIS — J309 Allergic rhinitis, unspecified: Principal | ICD-10-CM

## 2022-09-19 MED ORDER — DESLORATADINE 5 MG DISINTEGRATING TABLET
ORAL_TABLET | Freq: Every day | 2 refills | 30 days | Status: CP
Start: 2022-09-19 — End: 2023-09-19

## 2022-09-23 DIAGNOSIS — J309 Allergic rhinitis, unspecified: Principal | ICD-10-CM

## 2022-09-23 MED ORDER — DESLORATADINE 5 MG TABLET
ORAL_TABLET | Freq: Every day | ORAL | 1 refills | 90 days | Status: CP
Start: 2022-09-23 — End: 2023-09-23

## 2022-10-02 DIAGNOSIS — R7989 Other specified abnormal findings of blood chemistry: Principal | ICD-10-CM

## 2022-10-23 ENCOUNTER — Ambulatory Visit: Admit: 2022-10-23 | Discharge: 2022-10-24 | Payer: PRIVATE HEALTH INSURANCE

## 2022-10-23 ENCOUNTER — Ambulatory Visit: Admit: 2022-10-23 | Discharge: 2022-10-24 | Payer: PRIVATE HEALTH INSURANCE | Attending: Family | Primary: Family

## 2022-10-23 DIAGNOSIS — M546 Pain in thoracic spine: Principal | ICD-10-CM

## 2022-10-23 DIAGNOSIS — Z113 Encounter for screening for infections with a predominantly sexual mode of transmission: Principal | ICD-10-CM

## 2022-10-23 DIAGNOSIS — B070 Plantar wart: Principal | ICD-10-CM

## 2022-10-23 DIAGNOSIS — H6123 Impacted cerumen, bilateral: Principal | ICD-10-CM

## 2022-10-23 DIAGNOSIS — R7989 Other specified abnormal findings of blood chemistry: Principal | ICD-10-CM

## 2022-10-23 DIAGNOSIS — K219 Gastro-esophageal reflux disease without esophagitis: Principal | ICD-10-CM

## 2022-10-23 DIAGNOSIS — G8929 Other chronic pain: Principal | ICD-10-CM

## 2022-10-23 DIAGNOSIS — R232 Flushing: Principal | ICD-10-CM

## 2022-10-23 MED ORDER — METOCLOPRAMIDE 5 MG TABLET
ORAL_TABLET | Freq: Four times a day (QID) | ORAL | 1 refills | 30 days
Start: 2022-10-23 — End: 2023-10-23

## 2022-10-24 ENCOUNTER — Telehealth
Admit: 2022-10-24 | Discharge: 2022-10-25 | Payer: PRIVATE HEALTH INSURANCE | Attending: Social Worker | Primary: Social Worker

## 2022-10-24 ENCOUNTER — Ambulatory Visit
Admit: 2022-10-24 | Discharge: 2022-10-25 | Payer: PRIVATE HEALTH INSURANCE | Attending: Social Worker | Primary: Social Worker

## 2022-10-24 DIAGNOSIS — F411 Generalized anxiety disorder: Principal | ICD-10-CM

## 2022-10-24 MED ORDER — METOCLOPRAMIDE 5 MG TABLET
ORAL_TABLET | Freq: Four times a day (QID) | ORAL | 1 refills | 30 days | Status: CP
Start: 2022-10-24 — End: 2023-10-24

## 2022-11-20 ENCOUNTER — Telehealth
Admit: 2022-11-20 | Discharge: 2022-11-21 | Payer: PRIVATE HEALTH INSURANCE | Attending: Social Worker | Primary: Social Worker

## 2022-11-20 DIAGNOSIS — F411 Generalized anxiety disorder: Principal | ICD-10-CM

## 2022-12-04 ENCOUNTER — Telehealth
Admit: 2022-12-04 | Discharge: 2022-12-05 | Payer: PRIVATE HEALTH INSURANCE | Attending: Social Worker | Primary: Social Worker

## 2022-12-04 DIAGNOSIS — F411 Generalized anxiety disorder: Principal | ICD-10-CM

## 2022-12-17 DIAGNOSIS — Z91013 Allergy to seafood: Principal | ICD-10-CM

## 2022-12-17 MED ORDER — EPINEPHRINE 0.3 MG/0.3 ML INJECTION, AUTO-INJECTOR
Freq: Once | INTRAMUSCULAR | 1 refills | 1 days | Status: CP
Start: 2022-12-17 — End: 2022-12-17

## 2022-12-18 ENCOUNTER — Telehealth
Admit: 2022-12-18 | Discharge: 2022-12-19 | Payer: PRIVATE HEALTH INSURANCE | Attending: Social Worker | Primary: Social Worker

## 2022-12-18 DIAGNOSIS — F411 Generalized anxiety disorder: Principal | ICD-10-CM

## 2022-12-20 DIAGNOSIS — J4520 Mild intermittent asthma, uncomplicated: Principal | ICD-10-CM

## 2022-12-20 MED ORDER — FLUTICASONE PROPIONATE 115 MCG-SALMETEROL 21 MCG/ACTUATION HFA INHALER
Freq: Two times a day (BID) | RESPIRATORY_TRACT | 11 refills | 30 days | Status: CP
Start: 2022-12-20 — End: 2023-12-20

## 2023-01-22 ENCOUNTER — Ambulatory Visit: Admit: 2023-01-22 | Discharge: 2023-01-23 | Payer: BLUE CROSS/BLUE SHIELD

## 2023-01-22 DIAGNOSIS — H9190 Unspecified hearing loss, unspecified ear: Principal | ICD-10-CM

## 2023-01-22 DIAGNOSIS — H938X3 Other specified disorders of ear, bilateral: Principal | ICD-10-CM

## 2023-01-22 DIAGNOSIS — H9311 Tinnitus, right ear: Principal | ICD-10-CM

## 2023-01-23 ENCOUNTER — Institutional Professional Consult (permissible substitution)
Admit: 2023-01-23 | Discharge: 2023-01-24 | Payer: BLUE CROSS/BLUE SHIELD | Attending: Social Worker | Primary: Social Worker

## 2023-01-23 DIAGNOSIS — F411 Generalized anxiety disorder: Principal | ICD-10-CM

## 2023-02-10 DIAGNOSIS — J309 Allergic rhinitis, unspecified: Principal | ICD-10-CM

## 2023-02-10 MED ORDER — DESLORATADINE 5 MG TABLET
ORAL_TABLET | Freq: Every day | ORAL | 1 refills | 90 days | Status: CP
Start: 2023-02-10 — End: 2024-02-10

## 2023-02-28 MED ORDER — DESLORATADINE 5 MG TABLET
ORAL_TABLET | Freq: Every day | ORAL | 1 refills | 90.00 days
Start: 2023-02-28 — End: 2024-02-28

## 2023-02-28 MED ORDER — METOCLOPRAMIDE 5 MG TABLET
ORAL_TABLET | Freq: Four times a day (QID) | ORAL | 1 refills | 30.00 days
Start: 2023-02-28 — End: 2024-02-28

## 2023-03-01 MED ORDER — METOCLOPRAMIDE 5 MG TABLET
ORAL_TABLET | Freq: Four times a day (QID) | ORAL | 1 refills | 30.00 days | Status: CP
Start: 2023-03-01 — End: 2024-02-29

## 2023-03-01 MED ORDER — DESLORATADINE 5 MG TABLET
ORAL_TABLET | Freq: Every day | ORAL | 3 refills | 90.00 days | Status: CP
Start: 2023-03-01 — End: 2024-02-29

## 2023-03-03 ENCOUNTER — Ambulatory Visit: Admit: 2023-03-03 | Discharge: 2023-03-04 | Payer: BLUE CROSS/BLUE SHIELD | Attending: Family | Primary: Family

## 2023-03-03 DIAGNOSIS — N6314 Unspecified lump in the right breast, lower inner quadrant: Principal | ICD-10-CM

## 2023-03-03 DIAGNOSIS — R000 Tachycardia, unspecified: Principal | ICD-10-CM

## 2023-03-03 DIAGNOSIS — R002 Palpitations: Principal | ICD-10-CM

## 2023-03-03 DIAGNOSIS — K219 Gastro-esophageal reflux disease without esophagitis: Principal | ICD-10-CM

## 2023-03-03 MED ORDER — METOCLOPRAMIDE 5 MG TABLET
ORAL_TABLET | Freq: Four times a day (QID) | ORAL | 1 refills | 30.00 days | Status: CP
Start: 2023-03-03 — End: 2024-03-02

## 2023-03-21 ENCOUNTER — Ambulatory Visit
Admit: 2023-03-21 | Discharge: 2023-03-22 | Payer: BLUE CROSS/BLUE SHIELD | Attending: Social Worker | Primary: Social Worker

## 2023-03-21 DIAGNOSIS — F411 Generalized anxiety disorder: Principal | ICD-10-CM

## 2023-04-16 ENCOUNTER — Ambulatory Visit
Admit: 2023-04-16 | Discharge: 2023-04-16 | Payer: BLUE CROSS/BLUE SHIELD | Attending: Social Worker | Primary: Social Worker

## 2023-04-16 ENCOUNTER — Ambulatory Visit
Admit: 2023-04-16 | Discharge: 2023-04-16 | Payer: BLUE CROSS/BLUE SHIELD | Attending: Student in an Organized Health Care Education/Training Program | Primary: Student in an Organized Health Care Education/Training Program

## 2023-04-16 DIAGNOSIS — R002 Palpitations: Principal | ICD-10-CM

## 2023-04-16 DIAGNOSIS — R000 Tachycardia, unspecified: Principal | ICD-10-CM

## 2023-04-23 ENCOUNTER — Ambulatory Visit
Admit: 2023-04-23 | Discharge: 2023-04-23 | Payer: BLUE CROSS/BLUE SHIELD | Attending: Audiologist | Primary: Audiologist

## 2023-04-23 ENCOUNTER — Ambulatory Visit: Admit: 2023-04-23 | Discharge: 2023-04-23 | Payer: BLUE CROSS/BLUE SHIELD

## 2023-04-23 DIAGNOSIS — H9313 Tinnitus, bilateral: Principal | ICD-10-CM

## 2023-04-23 DIAGNOSIS — H6123 Impacted cerumen, bilateral: Principal | ICD-10-CM

## 2023-04-23 DIAGNOSIS — H938X3 Other specified disorders of ear, bilateral: Principal | ICD-10-CM

## 2023-04-24 ENCOUNTER — Ambulatory Visit: Admit: 2023-04-24 | Discharge: 2023-04-25 | Payer: BLUE CROSS/BLUE SHIELD

## 2023-05-02 ENCOUNTER — Ambulatory Visit
Admit: 2023-05-02 | Discharge: 2023-05-03 | Payer: BLUE CROSS/BLUE SHIELD | Attending: Social Worker | Primary: Social Worker

## 2023-05-02 DIAGNOSIS — F411 Generalized anxiety disorder: Principal | ICD-10-CM

## 2023-05-16 DIAGNOSIS — R1013 Epigastric pain: Principal | ICD-10-CM

## 2023-05-16 DIAGNOSIS — K2970 Gastritis, unspecified, without bleeding: Principal | ICD-10-CM

## 2023-05-16 MED ORDER — PANTOPRAZOLE 40 MG TABLET,DELAYED RELEASE
ORAL_TABLET | Freq: Every day | ORAL | 3 refills | 90.00 days | Status: CP
Start: 2023-05-16 — End: 2024-05-15

## 2023-05-21 ENCOUNTER — Ambulatory Visit
Admit: 2023-05-21 | Discharge: 2023-05-21 | Payer: BLUE CROSS/BLUE SHIELD | Attending: Physician Assistant | Primary: Physician Assistant

## 2023-05-21 ENCOUNTER — Ambulatory Visit
Admit: 2023-05-21 | Discharge: 2023-05-21 | Payer: BLUE CROSS/BLUE SHIELD | Attending: Social Worker | Primary: Social Worker

## 2023-05-21 DIAGNOSIS — F411 Generalized anxiety disorder: Principal | ICD-10-CM

## 2023-05-21 DIAGNOSIS — K760 Fatty (change of) liver, not elsewhere classified: Principal | ICD-10-CM

## 2023-05-21 DIAGNOSIS — R140 Abdominal distension (gaseous): Principal | ICD-10-CM

## 2023-06-25 ENCOUNTER — Ambulatory Visit
Admit: 2023-06-25 | Discharge: 2023-06-25 | Payer: BLUE CROSS/BLUE SHIELD | Attending: Internal Medicine | Primary: Internal Medicine

## 2023-06-25 ENCOUNTER — Ambulatory Visit
Admit: 2023-06-25 | Discharge: 2023-06-25 | Payer: BLUE CROSS/BLUE SHIELD | Attending: Student in an Organized Health Care Education/Training Program | Primary: Student in an Organized Health Care Education/Training Program

## 2023-06-25 DIAGNOSIS — R002 Palpitations: Principal | ICD-10-CM

## 2023-06-25 DIAGNOSIS — Z91013 Allergy to seafood: Principal | ICD-10-CM

## 2023-06-25 DIAGNOSIS — D500 Iron deficiency anemia secondary to blood loss (chronic): Principal | ICD-10-CM

## 2023-06-25 MED ORDER — FERROUS SULFATE 324 MG (65 MG IRON) TABLET,DELAYED RELEASE
ORAL_TABLET | ORAL | 3 refills | 90.00 days | Status: CP
Start: 2023-06-25 — End: 2024-06-19

## 2023-06-25 MED ORDER — AZELASTINE 137 MCG (0.1 %) NASAL SPRAY
Freq: Two times a day (BID) | NASAL | 12 refills | 55.00 days | Status: CP
Start: 2023-06-25 — End: ?

## 2023-07-07 MED ORDER — FERROUS SULFATE 324 MG (65 MG IRON) TABLET,DELAYED RELEASE
ORAL_TABLET | ORAL | 3 refills | 90.00000 days | Status: CP
Start: 2023-07-07 — End: 2024-07-01

## 2023-07-16 ENCOUNTER — Ambulatory Visit
Admit: 2023-07-16 | Discharge: 2023-07-17 | Payer: BLUE CROSS/BLUE SHIELD | Attending: Social Worker | Primary: Social Worker

## 2023-07-16 DIAGNOSIS — F411 Generalized anxiety disorder: Principal | ICD-10-CM

## 2023-07-17 ENCOUNTER — Encounter: Admit: 2023-07-17 | Discharge: 2023-07-18 | Payer: BLUE CROSS/BLUE SHIELD

## 2023-07-17 DIAGNOSIS — H1031 Unspecified acute conjunctivitis, right eye: Principal | ICD-10-CM

## 2023-07-17 MED ORDER — TOBRAMYCIN 0.3 % EYE DROPS
OPHTHALMIC | 0 refills | 17.00000 days | Status: CP
Start: 2023-07-17 — End: 2023-07-27

## 2023-07-23 ENCOUNTER — Ambulatory Visit: Admit: 2023-07-23 | Discharge: 2023-07-24 | Payer: BLUE CROSS/BLUE SHIELD

## 2023-07-23 DIAGNOSIS — H938X3 Other specified disorders of ear, bilateral: Principal | ICD-10-CM

## 2023-07-23 DIAGNOSIS — H6123 Impacted cerumen, bilateral: Principal | ICD-10-CM

## 2023-07-23 DIAGNOSIS — K219 Gastro-esophageal reflux disease without esophagitis: Principal | ICD-10-CM

## 2023-07-23 MED ORDER — METOCLOPRAMIDE 5 MG TABLET
ORAL_TABLET | Freq: Four times a day (QID) | ORAL | 1 refills | 30.00000 days | Status: CP
Start: 2023-07-23 — End: 2024-07-22

## 2023-08-18 DIAGNOSIS — K2970 Gastritis, unspecified, without bleeding: Principal | ICD-10-CM

## 2023-08-18 DIAGNOSIS — R1013 Epigastric pain: Principal | ICD-10-CM

## 2023-08-18 MED ORDER — PANTOPRAZOLE 40 MG TABLET,DELAYED RELEASE
ORAL_TABLET | Freq: Every day | ORAL | 3 refills | 90.00000 days
Start: 2023-08-18 — End: ?

## 2023-08-25 MED ORDER — PANTOPRAZOLE 40 MG TABLET,DELAYED RELEASE
ORAL_TABLET | Freq: Every day | ORAL | 3 refills | 90.00000 days | Status: CP
Start: 2023-08-25 — End: ?

## 2023-08-27 DIAGNOSIS — F411 Generalized anxiety disorder: Principal | ICD-10-CM

## 2023-09-11 ENCOUNTER — Encounter: Admit: 2023-09-11 | Discharge: 2023-09-12 | Payer: BLUE CROSS/BLUE SHIELD

## 2023-09-11 ENCOUNTER — Ambulatory Visit: Admit: 2023-09-11 | Discharge: 2023-09-12 | Payer: BLUE CROSS/BLUE SHIELD

## 2023-09-11 DIAGNOSIS — K7581 Nonalcoholic steatohepatitis (NASH): Principal | ICD-10-CM

## 2023-09-18 ENCOUNTER — Inpatient Hospital Stay: Admit: 2023-09-18 | Discharge: 2023-09-18 | Payer: BLUE CROSS/BLUE SHIELD

## 2023-09-18 DIAGNOSIS — N6314 Unspecified lump in the right breast, lower inner quadrant: Principal | ICD-10-CM

## 2023-09-18 DIAGNOSIS — K219 Gastro-esophageal reflux disease without esophagitis: Principal | ICD-10-CM

## 2023-09-18 DIAGNOSIS — R000 Tachycardia, unspecified: Principal | ICD-10-CM

## 2023-09-18 DIAGNOSIS — R002 Palpitations: Principal | ICD-10-CM

## 2023-10-15 ENCOUNTER — Ambulatory Visit
Admission: EM | Admit: 2023-10-15 | Discharge: 2023-10-15 | Disposition: A | Discharge status: Home / Self Care | Attending: Family Medicine | Admitting: Family Medicine

## 2023-10-15 ENCOUNTER — Encounter: Payer: Self-pay | Admitting: Emergency Medicine

## 2023-10-15 DIAGNOSIS — L241 Irritant contact dermatitis due to oils and greases: Secondary | ICD-10-CM | POA: Diagnosis not present

## 2023-10-15 DIAGNOSIS — T7840XA Allergy, unspecified, initial encounter: Secondary | ICD-10-CM

## 2023-10-15 MED ORDER — ONDANSETRON HCL 4 MG PO TABS: 4.0000 mg | ORAL_TABLET | Freq: Four times a day (QID) | ORAL | 0 refills | Status: AC

## 2023-10-15 MED ORDER — DEXAMETHASONE SODIUM PHOSPHATE 10 MG/ML IJ SOLN
10.0000 mg | Freq: Once | INTRAMUSCULAR | Status: AC
  Administered 2023-10-15: 10 mg via INTRAMUSCULAR

## 2023-10-15 MED ORDER — PREDNISONE 10 MG (21) PO TBPK: ORAL_TABLET | Freq: Every day | ORAL | 0 refills | Status: AC

## 2023-10-15 MED ORDER — MUPIROCIN 2 % EX OINT: 1.0000 | TOPICAL_OINTMENT | Freq: Two times a day (BID) | CUTANEOUS | 0 refills | Status: AC

## 2023-10-15 MED ORDER — TRIAMCINOLONE ACETONIDE 0.1 % EX OINT
1.0000 | TOPICAL_OINTMENT | Freq: Two times a day (BID) | CUTANEOUS | 1 refills | Status: AC
Start: 2023-10-15 — End: ?

## 2023-10-15 NOTE — ED Provider Notes (Addendum)
 [facility]    CSN: [id number] Arrival date & time: 10/15/23  1749      History   Chief Complaint Chief Complaint  Patient presents with   Rash    HPI Marissa Savage is a 33 y.o. female.   HPI  [name] presents for concerning rash after working in the yard Thursday. The rash started 3 days later.  Has rash on both arms, legs, face and neck.   Tried cortisone lotion, rubbing alcohol, witch hazel and calamine lotion without relief.  She assumes she came in contact with poison sumac or oak in an overgrown area that needed to get cut  back.  Her dog goes outside then comes back inside.   There is been no new products including soaps and detergents.  No eye irritation, sore throat, difficulty breathing, vomiting.  Denies belly pain, joint pain and fever.  Endorses chest tightness, slight difficulty breathing, nausea  and diarrhea. There has been no medication changes or new supplements.  Denies any new foods or drinks.     Past Medical History:  Diagnosis Date   Asthma    Enlarged lymph nodes    Mold exposure 09/2018   Vocal cord dysfunction     Patient Active Problem List   Diagnosis Date Noted   Dysphonia 04/27/2020   Elevated LFTs 12/02/2018   Hepatic steatosis 12/02/2018   Mild intermittent asthma without complication 10/22/2018   Chronic stomach ulcer 05/14/2018    Past Surgical History:  Procedure Laterality Date   LIVER BIOPSY  2020   WISDOM TOOTH EXTRACTION      OB History     Gravida  0   Para  0   Term  0   Preterm  0   AB  0   Living  0      SAB  0   IAB  0   Ectopic  0   Multiple  0   Live Births  0            Home Medications    Prior to Admission medications   Medication Sig Start Date End Date Taking? Authorizing Provider  EPINEPHrine 0.3 mg/0.3 mL IJ SOAJ injection Inject 0.3 mg into the muscle. 12/17/22  Yes [provider]  metoCLOPramide (REGLAN) 5 MG tablet Take 5 mg by mouth. 07/23/23 07/22/24 Yes  [provider]  mupirocin  ointment (BACTROBAN ) 2 % Apply 1 Application topically 2 (two) times daily. 10/15/23  Yes Khandi Kernes, DO  ondansetron  (ZOFRAN ) 4 MG tablet Take 1 tablet (4 mg total) by mouth every 6 (six) hours. 10/15/23  Yes Vaishnavi Dalby, DO  pantoprazole (PROTONIX) 40 MG tablet Take 40 mg by mouth daily. 08/25/23  Yes [provider]  predniSONE  (STERAPRED UNI-PAK 21 TAB) 10 MG (21) TBPK tablet Take by mouth daily. Take 6 tabs by mouth daily for 1, then 5 tabs for 1 day, then 4 tabs for 1 day, then 3 tabs for 1 day, then 2 tabs for 1 day, then 1 tab for 1 day. 10/15/23  Yes Juliona Vales, DO  triamcinolone  ointment (KENALOG ) 0.1 % Apply 1 Application topically 2 (two) times daily. 10/15/23  Yes Jaydee Ingman, DO  albuterol (VENTOLIN HFA) 108 (90 Base) MCG/ACT inhaler Inhale into the lungs. 10/19/18   [provider]  dicyclomine (BENTYL) 20 MG tablet Take by mouth. 10/22/18 10/22/19  [provider]  fluticasone (FLONASE) 50 MCG/ACT nasal spray Place 2 sprays into the nose.    [provider]  fluticasone (FLOVENT HFA) 110 MCG/ACT inhaler Inhale into the lungs.    [provider]    Family History Family History  Problem Relation Age of Onset   Breast cancer Maternal Aunt    Leukemia Maternal Grandfather    Stomach cancer Paternal Grandmother    Pancreatic cancer Paternal Grandfather    Healthy Mother    Heart disease Father     Social History Social History   Tobacco Use   Smoking status: Never   Smokeless tobacco: Never  Vaping Use   Vaping status: Never Used  Substance Use Topics   Alcohol use: Not Currently    Comment: occ   Drug use: Yes    Types: Marijuana     Allergies   Ciprofloxacin, Cetirizine-pseudoephedrine er, Shellfish allergy, and Zyrtec [cetirizine]   Review of Systems Review of Systems :negative unless otherwise stated in HPI.      Physical Exam Triage Vital Signs ED Triage Vitals   Encounter Vitals Group     BP 10/15/23 1807 119/77     Girls Systolic BP Percentile --      Girls Diastolic BP Percentile --      Boys Systolic BP Percentile --      Boys Diastolic BP Percentile --      Pulse Rate 10/15/23 1807 77     Resp 10/15/23 1807 18     Temp 10/15/23 1807 98.1 F (36.7 C)     Temp Source 10/15/23 1807 Oral     SpO2 10/15/23 1807 98 %     Weight --      Height --      Head Circumference --      Peak Flow --      Pain Score 10/15/23 1805 3     Pain Loc --      Pain Education --      Exclude from Growth Chart --    No data found.  Updated Vital Signs BP 119/77 (BP Location: Right Arm)   Pulse 77   Temp 98.1 F (36.7 C) (Oral)   Resp 18   LMP 09/11/2023   SpO2 98%   Visual Acuity Right Eye Distance:   Left Eye Distance:   Bilateral Distance:    Right Eye Near:   Left Eye Near:    Bilateral Near:     Physical Exam  GEN: alert, well appearing female, in no acute distress  EYES: no scleral injection or discharge, right eyelid erythema, no blistering HENT: Moist mucous membranes, no erythema or lesions, no tongue or uvula edema CV: regular rate and rhythm  RESP: no increased work of breathing, clear to ascultation bilaterally MSK: no extremity edema  NEURO: alert, moves all extremities appropriately SKIN: warm and dry; erythematous patches with blisters and yellow crusting on all extremities worse on the left lower leg, midline neck erythema patch      UC Treatments / Results  Labs (all labs ordered are listed, but only abnormal results are displayed) Labs Reviewed - No data to display  EKG   Radiology No results found.  Procedures Procedures (including critical care time)  Medications Ordered in UC Medications  dexamethasone  (DECADRON ) injection 10 mg (10 mg Intramuscular Given 10/15/23 1828)    Initial Impression / Assessment and Plan / UC Course  I have reviewed the triage vital signs and the nursing notes.  Pertinent  labs & imaging results that were available during my care of the patient were reviewed by me  and considered in my medical decision making (see chart for details).       Contact Dermatitis Patient is a 33 y.o. female who presents for worsening rash for the 4 days.  Overall, patient is well-appearing and well-hydrated.  Vital signs stable.  Mirna JONELLE Baptist is afebrile.  Cardiopulmonary exam is unremarkable.  History and exam concerning for contact dermatitis.  Decadron  10 mg IM given.  Treat with prednisone  taper and steroid ointment.  Claritin or Zyrtec twice a day for additional itch relief. No sign of infection to suggest antifungals or antibiotics at this time.  Zofran  for nausea.  Bactroban  to prevent secondary skin infection due to scratching. EPI not indicated. She has an EPI pen at home.    Reviewed expectations regarding course of current medical issues.  All questions asked were answered.  Outlined signs and symptoms indicating need for more acute intervention. Patient verbalized understanding. After Visit Summary given.   Final Clinical Impressions(s) / UC Diagnoses   Final diagnoses:  Irritant contact dermatitis due to oils  Allergic reaction, initial encounter     Discharge Instructions      Stop by the pharmacy to pick up your prescriptions.  Follow up with your primary care provider or return to the urgent care, if not improving.       ED Prescriptions     Medication Sig Dispense Auth. Provider   predniSONE  (STERAPRED UNI-PAK 21 TAB) 10 MG (21) TBPK tablet Take by mouth daily. Take 6 tabs by mouth daily for 1, then 5 tabs for 1 day, then 4 tabs for 1 day, then 3 tabs for 1 day, then 2 tabs for 1 day, then 1 tab for 1 day. 21 tablet Courteney Alderete, DO   triamcinolone  ointment (KENALOG ) 0.1 % Apply 1 Application topically 2 (two) times daily. 30 g Mamoudou Mulvehill, DO   ondansetron  (ZOFRAN ) 4 MG tablet Take 1 tablet (4 mg total) by mouth every 6 (six) hours. 20 tablet  Charell Faulk, DO   mupirocin  ointment (BACTROBAN ) 2 % Apply 1 Application topically 2 (two) times daily. 22 g Lliam Hoh, DO      PDMP not reviewed this encounter.        Rozanne Heumann, DO 10/15/23 1916    Gray Doering, DO 10/15/23 [id number]

## 2023-10-15 NOTE — ED Triage Notes (Signed)
 Pt presents with a rash on her bilateral legs, arms, face and neck x 4 days. Pt believes it is poison oak and has tried OTC remedies that have not helped. She also reports that it is affecting her breathing and feels discomfort when using her CPAP at night.

## 2023-10-15 NOTE — Discharge Instructions (Addendum)
 Stop by the pharmacy to pick up your prescriptions.  Follow up with your primary care provider or return to the urgent care, if not improving.

## 2023-10-22 DIAGNOSIS — F411 Generalized anxiety disorder: Principal | ICD-10-CM

## 2023-10-29 DIAGNOSIS — R1310 Dysphagia, unspecified: Principal | ICD-10-CM

## 2023-10-29 DIAGNOSIS — H6123 Impacted cerumen, bilateral: Principal | ICD-10-CM

## 2023-12-11 ENCOUNTER — Inpatient Hospital Stay
Admit: 2023-12-11 | Discharge: 2023-12-11 | Payer: BLUE CROSS/BLUE SHIELD | Attending: Speech-Language Pathologist | Primary: Speech-Language Pathologist

## 2023-12-11 ENCOUNTER — Inpatient Hospital Stay: Admit: 2023-12-11 | Discharge: 2023-12-11 | Payer: BLUE CROSS/BLUE SHIELD

## 2024-02-25 DIAGNOSIS — R1310 Dysphagia, unspecified: Principal | ICD-10-CM

## 2024-02-25 DIAGNOSIS — H938X3 Other specified disorders of ear, bilateral: Principal | ICD-10-CM

## 2024-02-25 DIAGNOSIS — H6123 Impacted cerumen, bilateral: Principal | ICD-10-CM

## 2024-03-18 ENCOUNTER — Inpatient Hospital Stay: Admit: 2024-03-18 | Discharge: 2024-03-18 | Payer: BLUE CROSS/BLUE SHIELD

## 2024-03-18 DIAGNOSIS — N6314 Unspecified lump in the right breast, lower inner quadrant: Principal | ICD-10-CM

## 2024-03-19 ENCOUNTER — Encounter: Admitting: Obstetrics

## 2024-03-22 DIAGNOSIS — R1011 Right upper quadrant pain: Principal | ICD-10-CM

## 2024-03-25 ENCOUNTER — Inpatient Hospital Stay: Admit: 2024-03-25 | Discharge: 2024-03-25 | Payer: BLUE CROSS/BLUE SHIELD

## 2024-03-26 DIAGNOSIS — K838 Other specified diseases of biliary tract: Principal | ICD-10-CM

## 2024-03-26 DIAGNOSIS — R1010 Upper abdominal pain, unspecified: Principal | ICD-10-CM

## 2024-03-26 DIAGNOSIS — R9389 Abnormal findings on diagnostic imaging of other specified body structures: Principal | ICD-10-CM

## 2024-03-30 ENCOUNTER — Ambulatory Visit: Admit: 2024-03-30 | Discharge: 2024-03-31 | Payer: BLUE CROSS/BLUE SHIELD

## 2024-04-01 DIAGNOSIS — K219 Gastro-esophageal reflux disease without esophagitis: Principal | ICD-10-CM

## 2024-04-01 MED ORDER — METOCLOPRAMIDE 5 MG TABLET
ORAL_TABLET | Freq: Four times a day (QID) | ORAL | 1 refills | 30.00000 days | Status: CP
Start: 2024-04-01 — End: 2025-04-01

## 2024-04-21 ENCOUNTER — Encounter: Admitting: Obstetrics
